# Patient Record
Sex: Male | Born: 1955 | Race: White | Hispanic: No | Marital: Married | Smoking: Current every day smoker
Health system: Southern US, Community
[De-identification: ages and names within clinical notes are randomized; demographics above are authoritative.]

## PROBLEM LIST (undated history)

## (undated) DIAGNOSIS — F32A Depression, unspecified: Secondary | ICD-10-CM

## (undated) DIAGNOSIS — K429 Umbilical hernia without obstruction or gangrene: Secondary | ICD-10-CM

## (undated) DIAGNOSIS — R0600 Dyspnea, unspecified: Secondary | ICD-10-CM

## (undated) DIAGNOSIS — I10 Essential (primary) hypertension: Secondary | ICD-10-CM

## (undated) DIAGNOSIS — R918 Other nonspecific abnormal finding of lung field: Principal | ICD-10-CM

## (undated) DIAGNOSIS — F419 Anxiety disorder, unspecified: Secondary | ICD-10-CM

## (undated) DIAGNOSIS — R7303 Prediabetes: Secondary | ICD-10-CM

## (undated) DIAGNOSIS — G8929 Other chronic pain: Secondary | ICD-10-CM

## (undated) HISTORY — DX: Other nonspecific abnormal finding of lung field: R91.8

## (undated) HISTORY — PX: NO PAST SURGERIES: SHX2092

---

## 2013-04-03 ENCOUNTER — Encounter: Payer: Self-pay | Admitting: Family Medicine

## 2013-04-03 ENCOUNTER — Ambulatory Visit (INDEPENDENT_AMBULATORY_CARE_PROVIDER_SITE_OTHER): Payer: Managed Care, Other (non HMO) | Admitting: Family Medicine

## 2013-04-03 VITALS — BP 126/84 | HR 76 | Temp 98.7°F | Ht 72.0 in | Wt 212.0 lb

## 2013-04-03 DIAGNOSIS — R1031 Right lower quadrant pain: Secondary | ICD-10-CM

## 2013-04-03 DIAGNOSIS — Z1211 Encounter for screening for malignant neoplasm of colon: Secondary | ICD-10-CM

## 2013-04-03 DIAGNOSIS — R319 Hematuria, unspecified: Secondary | ICD-10-CM | POA: Insufficient documentation

## 2013-04-03 DIAGNOSIS — E663 Overweight: Secondary | ICD-10-CM

## 2013-04-03 LAB — POCT URINALYSIS DIPSTICK
Glucose, UA: NEGATIVE
Ketones, UA: NEGATIVE
Leukocytes, UA: NEGATIVE
Spec Grav, UA: 1.02
Urobilinogen, UA: 0.2

## 2013-04-03 NOTE — Assessment & Plan Note (Addendum)
Intermittent hematuria possibly present for years.   Small # RBC/hpf but present on exam - given smoking history and discomfort, I will obtain CT abd w/o contrast to eval for kidney stone as cause, will also obtain with contrast to eval bladder. Discussed DDx with patient. Pt agrees with plan. Discussed possible referral to urology depending on results of CT scan.

## 2013-04-03 NOTE — Assessment & Plan Note (Signed)
Not consistent with appendicitis, no hernia on exam today. ?ureteral stone related - await abd/pelvic CT scan ordered today.

## 2013-04-03 NOTE — Patient Instructions (Addendum)
Stool kit today. We will schedule CT scan of abdomen to evaluate for kidney stone or other cause of blood in urine. If normal CT scan, we will refer you to urologist for further evaluation. Return one Friday this week or next fasting for blood work (no lunch that day). Good to meet you today, call us with questions.

## 2013-04-03 NOTE — Progress Notes (Signed)
Subjective:    Patient ID: Terry [name], male    DOB: [birth year], 57 y.o.   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  rec f/u with PCP so presents today to establish.  F/u DOT physical scheduled 05/17/2013.  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   Unsure about family history. Smokes 1.5 ppd for 40 + yrs. No increase in exertion prior to urine test.  1 wk h/o RLQ pain, achey pain.  3-4/10 at its worse, currently mild.  Constant.  No dysuria, urgency, frequency,  No flank pain.  No nausea/vomiting, fevers/chills.  Caffeine: 3 cups coffee/day, 4 sodas/day Lives with wife, 1 dog Occupation: roll off truck driver Edu: [employer] Activity: no regular exercise, active some at work Diet: some water, mainly sodas, seldom fruits/vegetables, red meat 4-5 times/wk, no fish  Preventative: Colon cancer screening - discussed - will send home with iFOB. Body mass index is 28.75 kg/(m^2).  Possible lung cancer in family (smoker)  Medications and allergies reviewed and updated in chart.  Past histories reviewed and updated if relevant as below. There are no active problems to display for this patient.  Past Medical History  Diagnosis Date  . Hematuria    Past Surgical History  Procedure Laterality Date  . No past surgeries     History  Substance Use Topics  . Smoking status: Current Every Day Smoker -- 1.50 packs/day for 40 years    Types: Cigarettes    Start date: 06/14/1973  . Smokeless tobacco: Never Used  . Alcohol Use: No   Family History  Problem Relation Age of Onset  . Diabetes Mother   . Cancer Maternal Grandmother     unsure  . Arthritis Mother    No Known Allergies No current outpatient prescriptions on file prior to visit.   No current facility-administered medications on file prior to visit.     Review of Systems  Constitutional: Negative for fever, chills, activity change, appetite  change, fatigue and unexpected weight change.  HENT: Negative for hearing loss.   Eyes: Negative for visual disturbance.  Respiratory: Negative for cough, chest tightness, shortness of breath and wheezing.   Cardiovascular: Negative for chest pain, palpitations and leg swelling.  Gastrointestinal: Positive for abdominal pain (RLQ ache - see HPI). Negative for nausea, vomiting, diarrhea, constipation, blood in stool and abdominal distention.  Genitourinary: Negative for hematuria and difficulty urinating.  Musculoskeletal: Negative for arthralgias, myalgias and neck pain.  Skin: Negative for rash.  Neurological: Negative for dizziness, seizures, syncope and headaches.  Hematological: Negative for adenopathy. Does not bruise/bleed easily.  Psychiatric/Behavioral: Negative for dysphoric mood. The patient is not nervous/anxious.        Objective:   Physical Exam  Nursing note and vitals reviewed. Constitutional: He is oriented to person, place, and time. He appears well-developed and well-nourished. No distress.  HENT:  Head: Normocephalic and atraumatic.  Mouth/Throat: Oropharynx is clear and moist. No oropharyngeal exudate.  Eyes: Conjunctivae and EOM are normal. Pupils are equal, round, and reactive to light. No scleral icterus.  Neck: Normal range of motion. Neck supple. No thyromegaly present.  Cardiovascular: Normal rate, regular rhythm, normal heart sounds and intact distal pulses.   No murmur heard. Pulses:      Radial pulses are 2+ on the right side, and 2+ on the left side.  Pulmonary/Chest: Effort normal and breath sounds normal. No respiratory  distress. He has no wheezes. He has no rales.  Abdominal: Soft. Bowel sounds are normal. He exhibits no distension and no mass. There is no hepatosplenomegaly. There is tenderness (mild) in the right lower quadrant. There is no rigidity, no rebound, no guarding, no CVA tenderness and negative Murphy's sign. No hernia. Hernia confirmed  negative in the right inguinal area and confirmed negative in the left inguinal area.  Musculoskeletal: Normal range of motion. He exhibits no edema.  Lymphadenopathy:    He has no cervical adenopathy.       Right: No inguinal adenopathy present.       Left: No inguinal adenopathy present.  Neurological: He is alert and oriented to person, place, and time.  CN grossly intact, station and gait intact  Skin: Skin is warm and dry. No rash noted.  Psychiatric: He has a normal mood and affect. His behavior is normal. Judgment and thought content normal.       Assessment & Plan:

## 2013-04-06 ENCOUNTER — Ambulatory Visit (INDEPENDENT_AMBULATORY_CARE_PROVIDER_SITE_OTHER)
Admission: RE | Admit: 2013-04-06 | Discharge: 2013-04-06 | Disposition: A | Discharge status: Home / Self Care | Payer: Managed Care, Other (non HMO) | Source: Ambulatory Visit | Attending: Family Medicine | Admitting: Family Medicine

## 2013-04-06 DIAGNOSIS — R319 Hematuria, unspecified: Secondary | ICD-10-CM

## 2013-04-06 MED ORDER — IOHEXOL 300 MG/ML  SOLN
125.0000 mL | Freq: Once | INTRAMUSCULAR | Status: AC | PRN
  Administered 2013-04-06: 125 mL via INTRAVENOUS

## 2013-04-07 ENCOUNTER — Other Ambulatory Visit: Payer: Self-pay | Admitting: Family Medicine

## 2013-04-07 DIAGNOSIS — R319 Hematuria, unspecified: Secondary | ICD-10-CM

## 2013-04-10 ENCOUNTER — Ambulatory Visit: Payer: Managed Care, Other (non HMO) | Admitting: Family Medicine

## 2013-04-10 DIAGNOSIS — R195 Other fecal abnormalities: Secondary | ICD-10-CM

## 2013-04-10 DIAGNOSIS — Z1211 Encounter for screening for malignant neoplasm of colon: Secondary | ICD-10-CM

## 2013-04-13 ENCOUNTER — Other Ambulatory Visit (INDEPENDENT_AMBULATORY_CARE_PROVIDER_SITE_OTHER): Payer: Managed Care, Other (non HMO)

## 2013-04-13 DIAGNOSIS — E7800 Pure hypercholesterolemia, unspecified: Secondary | ICD-10-CM

## 2013-04-13 DIAGNOSIS — R1031 Right lower quadrant pain: Secondary | ICD-10-CM

## 2013-04-13 LAB — LIPID PANEL
Cholesterol: 180 mg/dL (ref 0–200)
LDL Cholesterol: 112 mg/dL — ABNORMAL HIGH (ref 0–99)
Total CHOL/HDL Ratio: 4.5 Ratio
VLDL: 28 mg/dL (ref 0–40)

## 2013-04-13 LAB — COMPREHENSIVE METABOLIC PANEL
ALT: 14 U/L (ref 0–53)
AST: 16 U/L (ref 0–37)
Albumin: 4.2 g/dL (ref 3.5–5.2)
Alkaline Phosphatase: 56 U/L (ref 39–117)
CO2: 27 mEq/L (ref 19–32)
Calcium: 9.3 mg/dL (ref 8.4–10.5)
Chloride: 104 mEq/L (ref 96–112)
Potassium: 4.3 mEq/L (ref 3.5–5.3)
Total Protein: 6.9 g/dL (ref 6.0–8.3)

## 2013-04-13 LAB — CBC WITH DIFFERENTIAL/PLATELET
Basophils Absolute: 0.1 10*3/uL (ref 0.0–0.1)
Basophils Relative: 0 % (ref 0–1)
Eosinophils Absolute: 0.3 10*3/uL (ref 0.0–0.7)
Eosinophils Relative: 2 % (ref 0–5)
HCT: 41.4 % (ref 39.0–52.0)
Hemoglobin: 14.5 g/dL (ref 13.0–17.0)
MCH: 31.3 pg (ref 26.0–34.0)
MCHC: 35 g/dL (ref 30.0–36.0)
Monocytes Absolute: 1.5 10*3/uL — ABNORMAL HIGH (ref 0.1–1.0)
Monocytes Relative: 12 % (ref 3–12)
Neutro Abs: 5.6 10*3/uL (ref 1.7–7.7)
Neutrophils Relative %: 44 % (ref 43–77)
RDW: 14.1 % (ref 11.5–15.5)

## 2013-04-19 ENCOUNTER — Other Ambulatory Visit: Payer: Self-pay

## 2013-04-24 ENCOUNTER — Telehealth: Payer: Self-pay | Admitting: Family Medicine

## 2013-04-24 NOTE — Telephone Encounter (Signed)
Keithan's wife brought in a wellness screening form to be filled out on [place].

## 2013-04-24 NOTE — Telephone Encounter (Signed)
Form in your IN box for completion/signature.

## 2013-04-24 NOTE — Telephone Encounter (Signed)
Form faxed

## 2013-04-24 NOTE — Telephone Encounter (Signed)
Signed and placed in Kim's box. 

## 2013-05-15 ENCOUNTER — Encounter: Payer: Self-pay | Admitting: *Deleted

## 2013-07-01 ENCOUNTER — Telehealth: Payer: Self-pay | Admitting: Family Medicine

## 2013-07-01 ENCOUNTER — Encounter: Payer: Self-pay | Admitting: Family Medicine

## 2013-07-01 DIAGNOSIS — R918 Other nonspecific abnormal finding of lung field: Secondary | ICD-10-CM

## 2013-07-01 HISTORY — DX: Other nonspecific abnormal finding of lung field: R91.8

## 2013-07-01 NOTE — Telephone Encounter (Signed)
Due for CT lungs for f/u pulm nodules - will order.

## 2013-07-02 NOTE — Telephone Encounter (Addendum)
Spoke with radiologist who looked at images and given small size of pulm nodules actually recommended rpt CT scan in 1 year - so will be due to obtain CT chest in October. Will cancel CT scan - plz notify patient or wife

## 2013-07-06 NOTE — Telephone Encounter (Signed)
Message left for patient to return my call.  

## 2013-07-11 NOTE — Telephone Encounter (Signed)
Message left for patient to return my call.  

## 2013-07-16 ENCOUNTER — Encounter: Payer: Self-pay | Admitting: *Deleted

## 2013-07-16 NOTE — Telephone Encounter (Signed)
Letter mailed notifying patient. 

## 2014-04-02 ENCOUNTER — Other Ambulatory Visit: Payer: Self-pay | Admitting: Family Medicine

## 2014-04-02 DIAGNOSIS — R918 Other nonspecific abnormal finding of lung field: Secondary | ICD-10-CM

## 2014-04-10 ENCOUNTER — Telehealth: Payer: Self-pay | Admitting: Family Medicine

## 2014-04-10 NOTE — Telephone Encounter (Signed)
Called the patient to schedule FU CT scan. Spoke with wife and she was checking when the best time would be to schedule. Received a VM from wife Steward DroneBrenda that her husband said he didn't want to have another CT Chest that if it wasn't broke dont fix it. I have left two more messages and they have not returned my calls. Pls advise.

## 2014-04-10 NOTE — Telephone Encounter (Signed)
Noted. Ok to cancel CT scan - will discuss at next visit.

## 2014-04-11 NOTE — Telephone Encounter (Signed)
Please cancel the CT order in Epic as I am not able to cancel your order, only Referrals.

## 2014-11-08 IMAGING — CT CT ABD-PEL WO/W CM
2 of 9 series · 11 of 46 positions shown, 18 images · IV contrast (omnipaque)
Comparison: None.

CLINICAL DATA: Hematuria, right lower quadrant discomfort

EXAM:
CT ABDOMEN AND PELVIS WITHOUT AND WITH CONTRAST
TECHNIQUE: Multidetector CT imaging of the abdomen and pelvis was performed
without contrast material in one or both body regions, followed by
contrast material(s) and further sections in one or both body
regions.
CONTRAST:  125mL OMNIPAQUE IOHEXOL 300 MG/ML  SOLN

[Series 2: hematuria without >45 · axial · non-contrast · 0.77mm/px · z∈[-406,+20]mm · 9 of 107 slices shown, 15 images]
[im 11/107  soft-tissue]
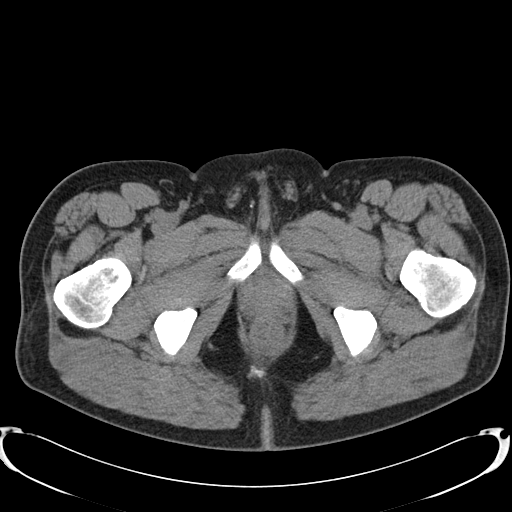
[im 11/107  bone]
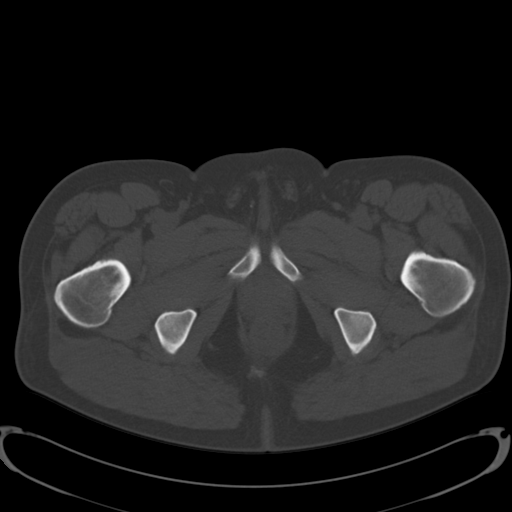
[im 22/107  soft-tissue]
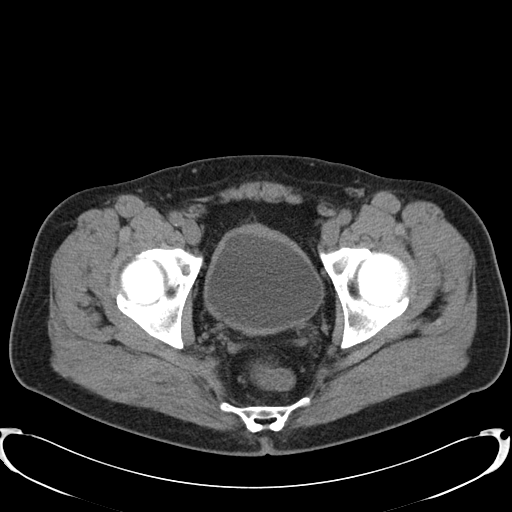
[im 32/107  soft-tissue]
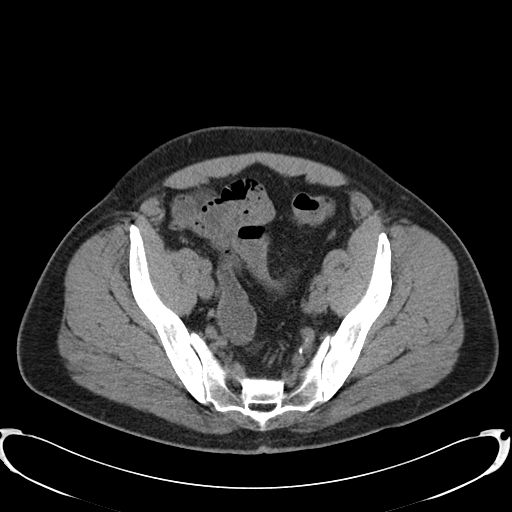
[im 43/107  soft-tissue]
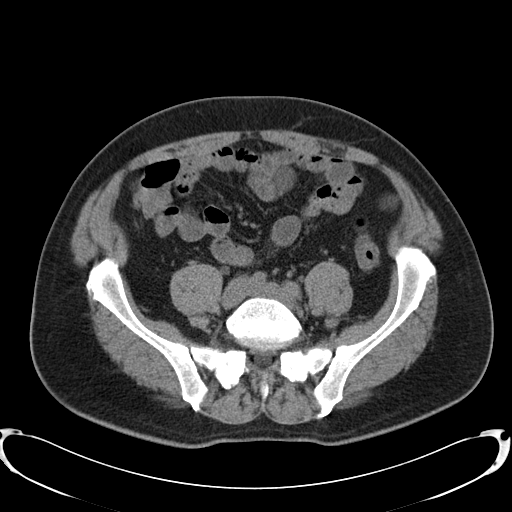
[im 54/107  soft-tissue]
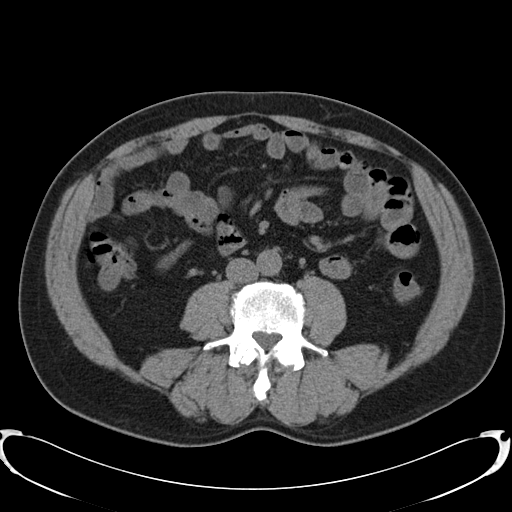
[im 64/107  soft-tissue]
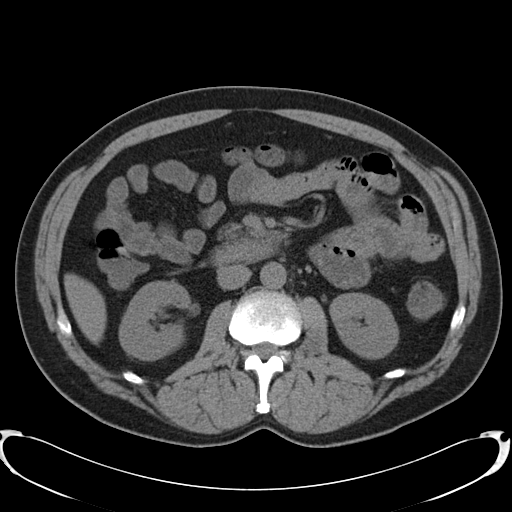
[im 64/107  lung]
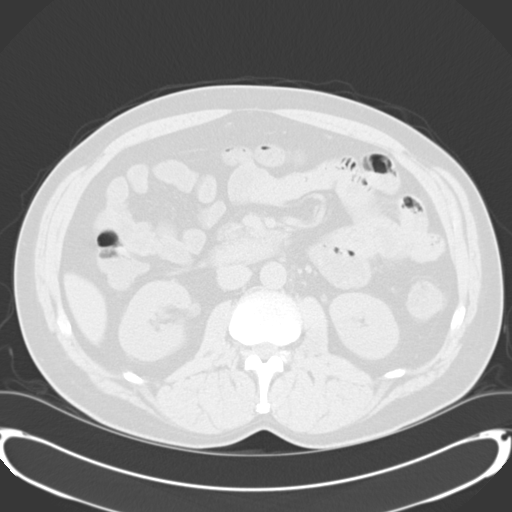
[im 75/107  soft-tissue]
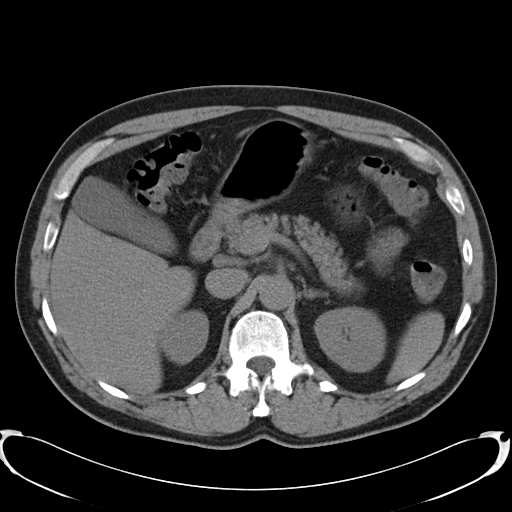
[im 75/107  lung]
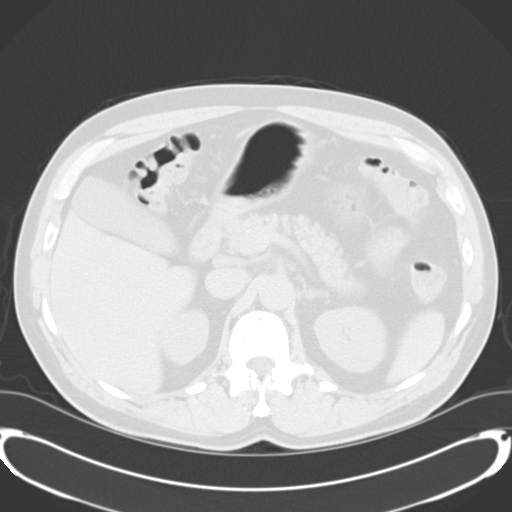
[im 85/107  soft-tissue]
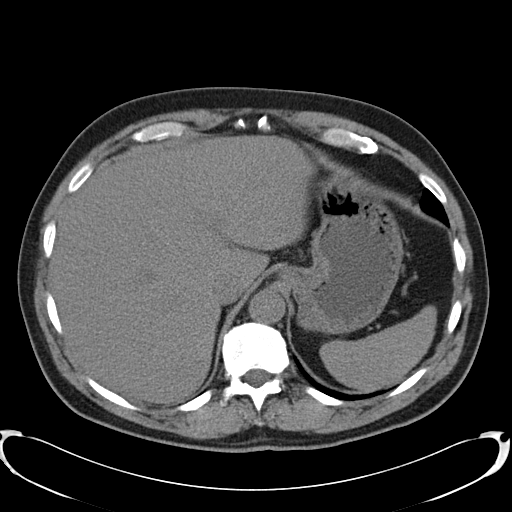
[im 85/107  lung]
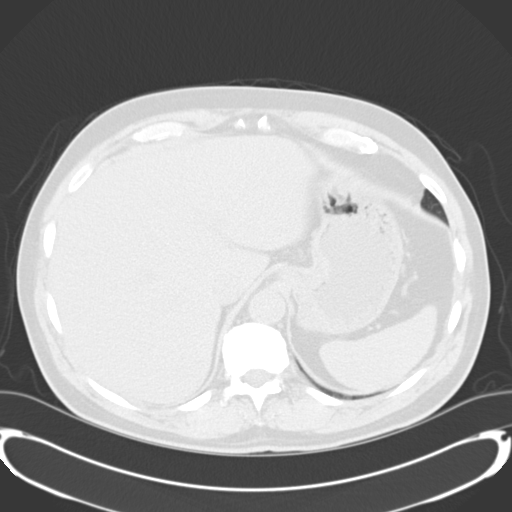
[im 96/107  soft-tissue]
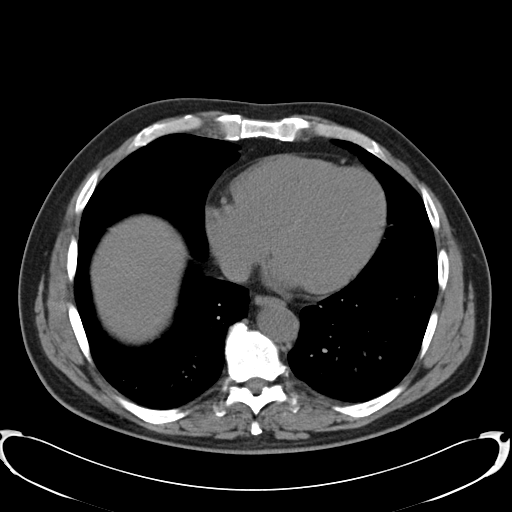
[im 96/107  lung]
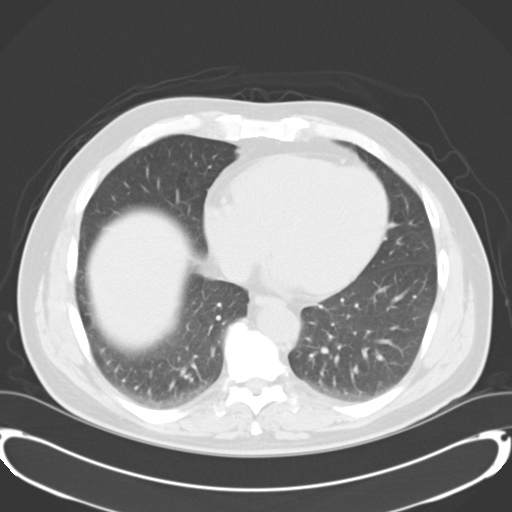
[im 96/107  bone]
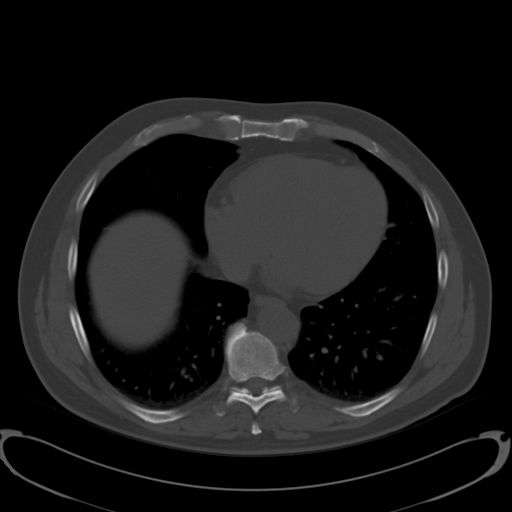

[Series 602: cor 100 sec delay · coronal · delayed · 1.08mm/px · 2 of 129 slices shown, 3 images]
[im 43/129  soft-tissue]
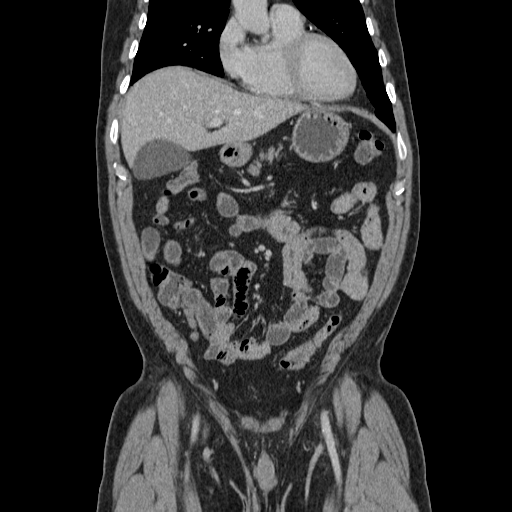
[im 43/129  bone]
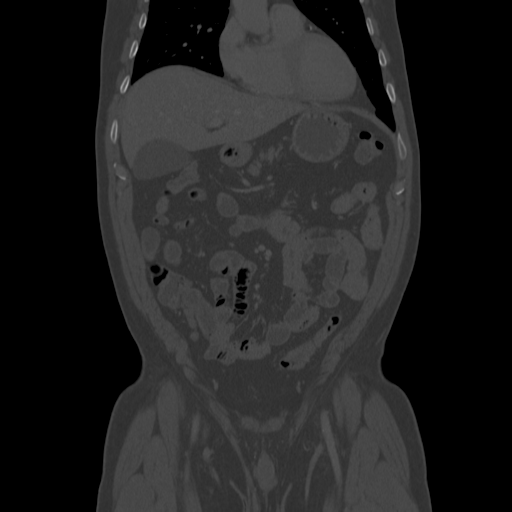
[im 86/129  soft-tissue]
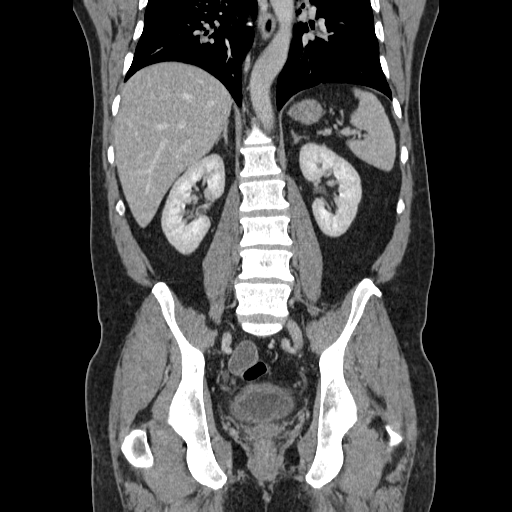

[11 of 46 positions shown; findings below may reference images not displayed]

FINDINGS: Mild nodularity in the right middle lobe (series 3/ image 1).
Additional 3 mm nodules in the left upper lobe (series 3/ image 1)
and left lower lobe (series 3/image 10).

Coronary atherosclerosis.

Liver, spleen, pancreas, and adrenal glands are within normal
limits.

Gallbladder is unremarkable. No intrahepatic or extrahepatic ductal
dilatation.

Kidneys are within normal limits. No renal calculi or
hydronephrosis.

No evidence of bowel obstruction. Normal appendix.

No evidence of abdominal aortic aneurysm.

No abdominopelvic ascites.

No suspicious abdominopelvic lymphadenopathy.

Prostate is unremarkable.

No ureteral or bladder calculi.

Mildly thick-walled bladder.

On delayed imaging, there are no filling defects in the bilateral
opacified proximal collecting systems, ureters, or bladder.

Mild degenerative changes of the visualized thoracolumbar spine.
IMPRESSION: No renal, ureteral, or bladder calculi. No hydronephrosis.

No enhancing renal lesions.

No filling defects in the bilateral opacified proximal collecting
systems, ureters, or bladder.

Mildly thick-walled bladder, correlate for cystitis.

Mild nodularity in the visualized right middle lobe, left upper
lobe, and left lower lobe. Consider dedicated CT chest for further
evaluation.

Coronary atherosclerosis.

## 2018-10-02 ENCOUNTER — Encounter: Payer: Self-pay | Admitting: Internal Medicine

## 2018-10-02 ENCOUNTER — Other Ambulatory Visit: Payer: Self-pay

## 2018-10-02 ENCOUNTER — Ambulatory Visit (INDEPENDENT_AMBULATORY_CARE_PROVIDER_SITE_OTHER)
Admission: RE | Admit: 2018-10-02 | Discharge: 2018-10-02 | Disposition: A | Discharge status: Home / Self Care | Payer: Managed Care, Other (non HMO) | Source: Ambulatory Visit | Attending: Internal Medicine | Admitting: Internal Medicine

## 2018-10-02 ENCOUNTER — Ambulatory Visit (INDEPENDENT_AMBULATORY_CARE_PROVIDER_SITE_OTHER): Payer: Managed Care, Other (non HMO) | Admitting: Internal Medicine

## 2018-10-02 DIAGNOSIS — G8929 Other chronic pain: Secondary | ICD-10-CM | POA: Diagnosis not present

## 2018-10-02 DIAGNOSIS — F17200 Nicotine dependence, unspecified, uncomplicated: Secondary | ICD-10-CM

## 2018-10-02 DIAGNOSIS — Z720 Tobacco use: Secondary | ICD-10-CM | POA: Insufficient documentation

## 2018-10-02 DIAGNOSIS — R918 Other nonspecific abnormal finding of lung field: Secondary | ICD-10-CM | POA: Diagnosis not present

## 2018-10-02 DIAGNOSIS — M79641 Pain in right hand: Secondary | ICD-10-CM

## 2018-10-02 NOTE — Assessment & Plan Note (Signed)
Will need follow up once the pandemic is over, we will get him scheduled for low dose lung CT for further evaluation of pulmonary nodules.

## 2018-10-02 NOTE — Progress Notes (Signed)
Virtual Visit via Video Note  I connected with Terry [name] on 10/02/18 at 10:45 AM EDT by a video enabled telemedicine application and verified that I am speaking with the correct person using two identifiers.   I discussed the limitations of evaluation and management by telemedicine and the availability of in person appointments. The patient expressed understanding and agreed to proceed.  Patient Location: Provider Location: Office  HPI  Pt here to reestablish care. He is transferring care from Dr. Sharen HonesGutierrez.  History of Pulmonary Nodules: He continues to smoke. He has no chronic cough or SOB. He has not had any follow up on this.  He reports chronic right hand pain. He reports this started 5 years ago. He describes the pain as tightness and throbbing. He has difficulty making a fist. He has some tingling in the index finger, and noticed swelling but denies numbness or weakness. He denies redness or warmth of the right hand. He has not noticed any rash. He denies any injury to the area. He has taken Ibuprofen in the past but reports when he does that, he notices blood in his stool so he doesn't take it anymore. He denies known hemorrhoids. He intermittently has rectal itching but no pain.  Smoker: 1.5 ppd x 40 years. Failed quitting with OTC. Patches. He has never tried Wellbutrin or Chantix. He is not interested in quitting at this time.  Flu: never Tetanus: unsure PSA Screening: > 5 years ago Colon Screening: never Vision Screening: as needed Dentist: annually  Past Medical History:  Diagnosis Date  . Hematuria   . Pulmonary nodules 07/01/2013    Current Outpatient Medications  Medication Sig Dispense Refill  . Multiple Vitamin (MULTIVITAMIN) tablet Take 1 tablet by mouth daily.    . Omega-3 Fatty Acids (FISH OIL) 1000 MG CAPS Take 1 capsule by mouth daily.    . vitamin B-12 (CYANOCOBALAMIN) 500 MCG tablet Take 500 mcg by mouth daily.     No current facility-administered  medications for this visit.     No Known Allergies  Family History  Problem Relation Age of Onset  . Diabetes Mother   . Cancer Maternal Grandmother        unsure, ?lung (smoker)  . Arthritis Mother     Social History   Socioeconomic History  . Marital status: Married    Spouse name: Not on file  . Number of children: Not on file  . Years of education: Not on file  . Highest education level: Not on file  Occupational History  . Not on file  Social Needs  . Financial resource strain: Not on file  . Food insecurity:    Worry: Not on file    Inability: Not on file  . Transportation needs:    Medical: Not on file    Non-medical: Not on file  Tobacco Use  . Smoking status: Current Every Day Smoker    Packs/day: 1.50    Years: 40.00    Pack years: 60.00    Types: Cigarettes    Start date: 06/14/1973  . Smokeless tobacco: Never Used  Substance and Sexual Activity  . Alcohol use: No  . Drug use: No  . Sexual activity: Not on file  Lifestyle  . Physical activity:    Days per week: Not on file    Minutes per session: Not on file  . Stress: Not on file  Relationships  . Social connections:    Talks on phone: Not on file  Gets together: Not on file    Attends religious service: Not on file    Active member of club or organization: Not on file    Attends meetings of clubs or organizations: Not on file    Relationship status: Not on file  . Intimate partner violence:    Fear of current or ex partner: Not on file    Emotionally abused: Not on file    Physically abused: Not on file    Forced sexual activity: Not on file  Other Topics Concern  . Not on file  Social History Narrative   Caffeine: 3 cups coffee/day, 4 sodas/day   Lives with wife, 1 dog   Occupation: roll off truck driver   Edu: HS   Activity: no regular exercise, active some at work   Diet: some water, mainly sodas, seldom fruits/vegetables, red meat 4-5 times/wk, no fish    ROS:  Constitutional:  Denies fever, malaise, fatigue, headache or abrupt weight changes.  HEENT: Denies eye pain, eye redness, ear pain, ringing in the ears, wax buildup, runny nose, nasal congestion, bloody nose, or sore throat. Respiratory: Denies difficulty breathing, shortness of breath, cough or sputum production.   Cardiovascular: Denies chest pain, chest tightness, palpitations or swelling in the hands or feet.  Gastrointestinal: Denies abdominal pain, bloating, constipation, diarrhea or blood in the stool.  GU: Denies frequency, urgency, pain with urination, blood in urine, odor or discharge. Musculoskeletal: Pt reports right hand pain, swelling, decreased range of motion. Denies difficulty with gait, muscle pain.  Skin: Denies redness, rashes, lesions or ulcercations.  Neurological: Pt reports tingling in right index finger. Denies dizziness, difficulty with memory, difficulty with speech or problems with balance and coordination.  Psych: Denies anxiety, depression, SI/HI.  No other specific complaints in a complete review of systems (except as listed in HPI above).  PE:   Wt Readings from Last 3 Encounters:  04/03/13 212 lb (96.2 kg)    General: Appears his stated age, well developed, well nourished in NAD. Skin: Dry and intact. No rash noted.  Pulmonary/Chest: Normal effort. No respiratory distress.  Musculoskeletal: Normal flexion, extension and rotation of the right wrist. No joint swelling noted of the wrist. Unable to completely flex fingers, normal extension. Joint enlargement noted of multiple knuckles. Neurological: Alert and oriented. Negative Phalen. Negative Tinel. Psychiatric: Mood and affect normal. Behavior is normal. Judgment and thought content normal.     BMET    Component Value Date/Time   NA 137 04/13/2013 1625   K 4.3 04/13/2013 1625   CL 104 04/13/2013 1625   CO2 27 04/13/2013 1625   GLUCOSE 82 04/13/2013 1625   BUN 12 04/13/2013 1625   CREATININE 0.87 04/13/2013 1625    CALCIUM 9.3 04/13/2013 1625    Lipid Panel     Component Value Date/Time   CHOL 180 04/13/2013 1625   TRIG 141 04/13/2013 1625   HDL 40 04/13/2013 1625   CHOLHDL 4.5 04/13/2013 1625   VLDL 28 04/13/2013 1625   LDLCALC 112 (H) 04/13/2013 1625    CBC    Component Value Date/Time   WBC 12.6 (H) 04/13/2013 1625   RBC 4.64 04/13/2013 1625   HGB 14.5 04/13/2013 1625   HCT 41.4 04/13/2013 1625   PLT 370 04/13/2013 1625   MCV 89.2 04/13/2013 1625   MCH 31.3 04/13/2013 1625   MCHC 35.0 04/13/2013 1625   RDW 14.1 04/13/2013 1625   LYMPHSABS 5.2 (H) 04/13/2013 1625   MONOABS 1.5 (H) 04/13/2013  1625   EOSABS 0.3 04/13/2013 1625   BASOSABS 0.1 04/13/2013 1625    Hgb A1C No results found for: HGBA1C   Assessment and Plan:  Chronic Right Hand Pain:  Will obtain xray right hand today Encouraged regular ROM exercises of right hand Consider daily Meloxicam  See problem based charting for other issues   Follow Up Instructions:    I discussed the assessment and treatment plan with the patient. The patient was provided an opportunity to ask questions and all were answered. The patient agreed with the plan and demonstrated an understanding of the instructions.   The patient was advised to call back or seek an in-person evaluation if the symptoms worsen or if the condition fails to improve as anticipated.    Nicki Reaper, NP

## 2018-10-02 NOTE — Patient Instructions (Signed)
Hand Pain  Many things can cause hand pain. Some common causes are:  · An injury.  · Repeating the same movement with your hand over and over (overuse).  · Osteoporosis.  · Arthritis.  · Lumps in the tendons or joints of the hand and wrist (ganglion cysts).  · Infection.  Follow these instructions at home:  Pay attention to any changes in your symptoms. Take these actions to help with your discomfort:  · If directed, put ice on the affected area:  ? Put ice in a plastic bag.  ? Place a towel between your skin and the bag.  ? Leave the ice on for 15-20 minutes, 3?4 times a day for 2 days.  · Take over-the-counter and prescription medicines only as told by your health care provider.  · Minimize stress on your hands and wrists as much as possible.  · Take breaks from repetitive activity often.  · Do stretches as told by your health care provider.  · Do not do activities that make your pain worse.  Contact a health care provider if:  · Your pain does not get better after a few days of self-care.  · Your pain gets worse.  · Your pain affects your ability to do your daily activities.  Get help right away if:  · Your hand becomes warm, red, or swollen.  · Your hand is numb or tingling.  · Your hand is extremely swollen or deformed.  · Your hand or fingers turn white or blue.  · You cannot move your hand, wrist, or fingers.  This information is not intended to replace advice given to you by your health care provider. Make sure you discuss any questions you have with your health care provider.  Document Released: 06/27/2015 Document Revised: 11/06/2015 Document Reviewed: 06/26/2014  Elsevier Interactive Patient Education © 2019 [employer]

## 2018-10-02 NOTE — Assessment & Plan Note (Signed)
Unmotivated to quit Discussed the importance of smoking cessation

## 2018-10-04 ENCOUNTER — Telehealth: Payer: Self-pay | Admitting: Internal Medicine

## 2018-10-04 MED ORDER — MELOXICAM 7.5 MG PO TABS: 7.5000 mg | ORAL_TABLET | Freq: Every day | ORAL | 2 refills | Status: DC

## 2018-10-04 NOTE — Telephone Encounter (Signed)
Patient is returning call from the office. ° °

## 2018-10-04 NOTE — Addendum Note (Signed)
Addended by: Roena Malady on: 10/04/2018 04:05 PM   Modules accepted: Orders

## 2018-12-08 ENCOUNTER — Other Ambulatory Visit: Payer: Self-pay

## 2018-12-08 ENCOUNTER — Encounter: Payer: Self-pay | Admitting: Internal Medicine

## 2018-12-08 ENCOUNTER — Ambulatory Visit (INDEPENDENT_AMBULATORY_CARE_PROVIDER_SITE_OTHER): Payer: Managed Care, Other (non HMO) | Admitting: Internal Medicine

## 2018-12-08 DIAGNOSIS — F4323 Adjustment disorder with mixed anxiety and depressed mood: Secondary | ICD-10-CM | POA: Insufficient documentation

## 2018-12-08 MED ORDER — CITALOPRAM HYDROBROMIDE 10 MG PO TABS: 10.0000 mg | ORAL_TABLET | Freq: Every day | ORAL | 3 refills | Status: DC

## 2018-12-08 MED ORDER — HYDROXYZINE HCL 10 MG PO TABS: 10.0000 mg | ORAL_TABLET | Freq: Every day | ORAL | 2 refills | Status: DC | PRN
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
## 2018-12-08 NOTE — Patient Instructions (Signed)

## 2018-12-27 ENCOUNTER — Other Ambulatory Visit: Payer: Self-pay | Admitting: Internal Medicine

## 2018-12-28 ENCOUNTER — Ambulatory Visit (INDEPENDENT_AMBULATORY_CARE_PROVIDER_SITE_OTHER): Payer: Self-pay | Admitting: Psychology

## 2018-12-28 DIAGNOSIS — F438 Other reactions to severe stress: Secondary | ICD-10-CM

## 2020-05-05 IMAGING — DX RIGHT HAND - COMPLETE 3+ VIEW
3 series · 3 of 3 positions shown · non-contrast
Comparison: None.

CLINICAL DATA: Pain

EXAM:
RIGHT HAND - COMPLETE 3+ VIEW

[hand ap]
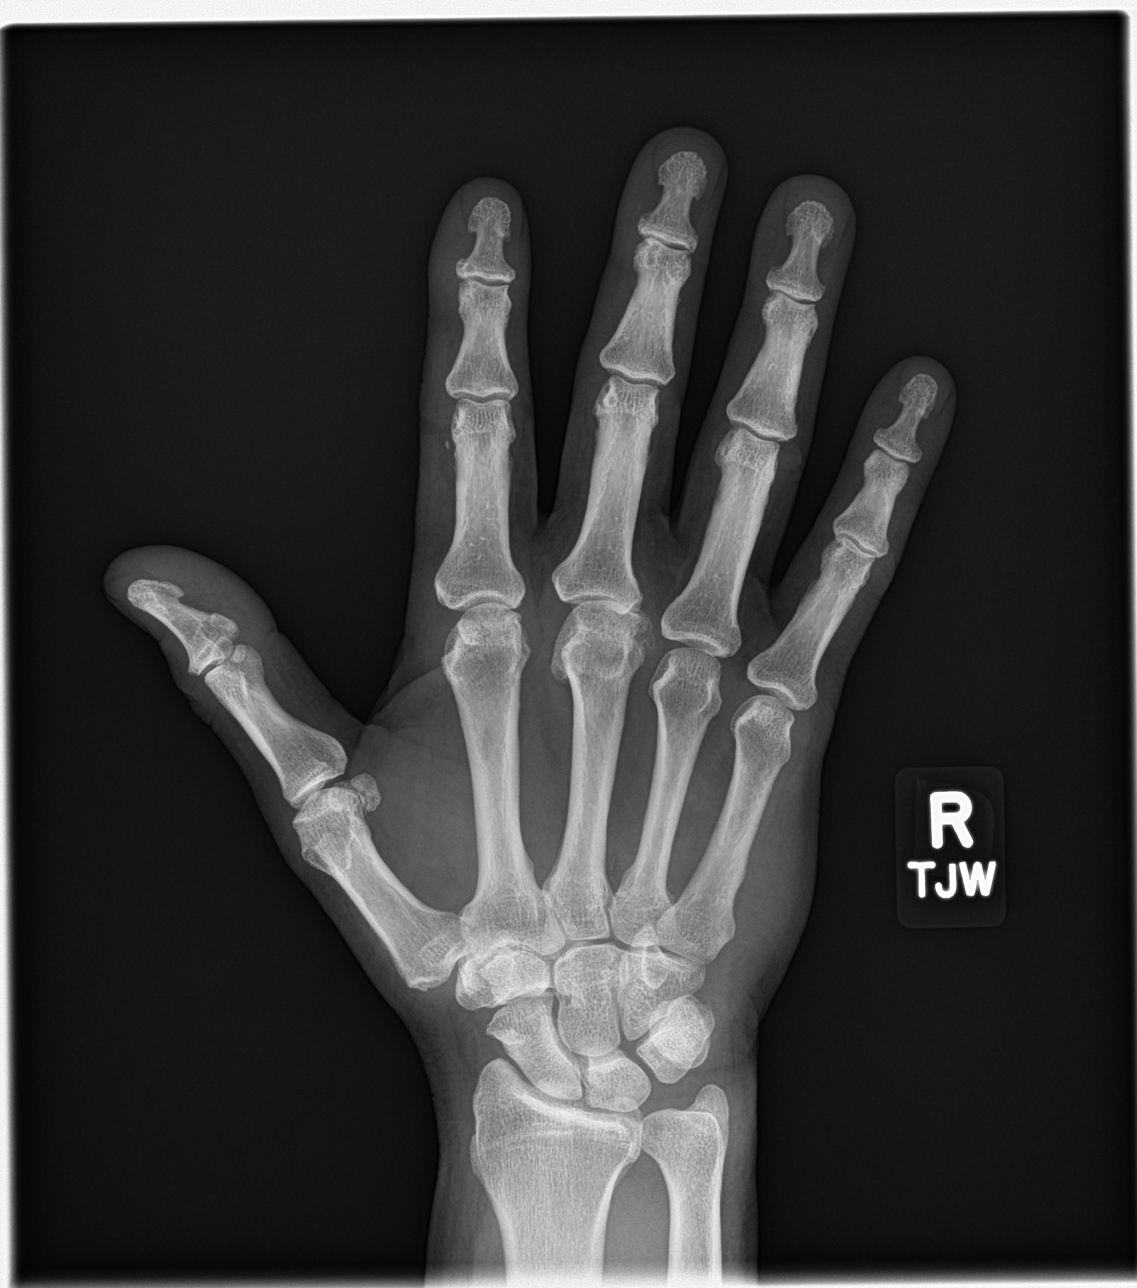

[hand obl]
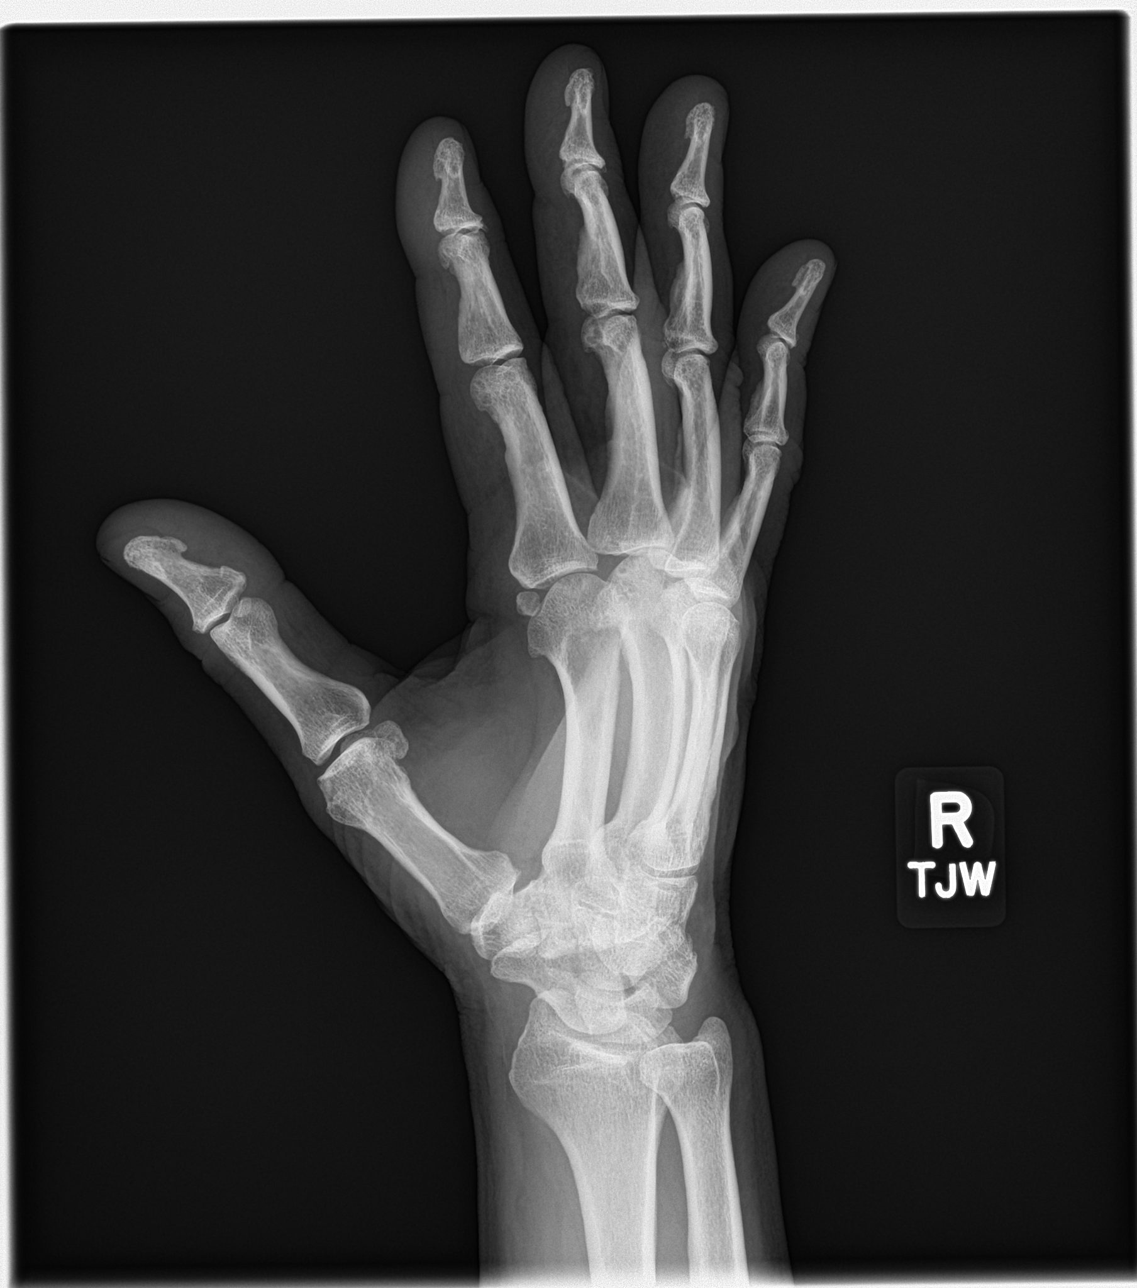

[hand lat]
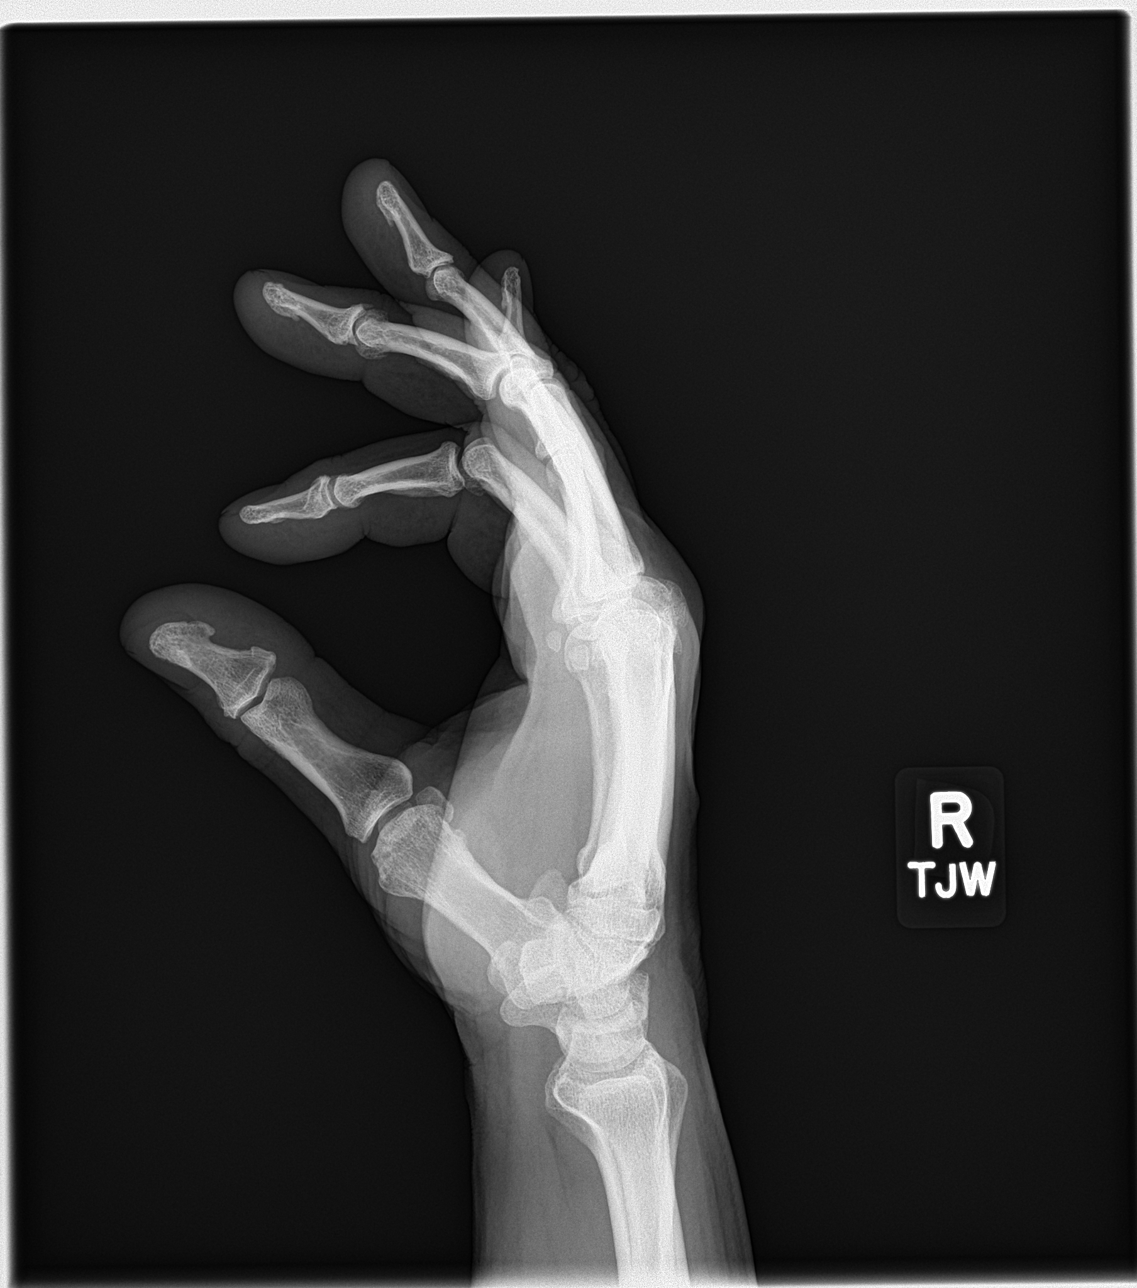

[3 of 3 positions shown; findings below may reference images not displayed]

FINDINGS: Frontal, oblique, and lateral views were obtained. There is no acute
fracture or dislocation. There is moderate osteoarthritic change in
the third MCP joint. There is milder osteoarthritic change in the
third and fourth DIP joints. No erosive changes. There is slight
calcification lateral to the distal aspect of the second proximal
phalanx which may represent localized tendinosis.
IMPRESSION: Scattered foci of osteoarthritic change, most notably in the third
MCP joint. No fracture or dislocation. No erosive change or
periostitis.

## 2022-11-22 ENCOUNTER — Ambulatory Visit: Payer: Self-pay | Admitting: Surgery

## 2022-11-22 DIAGNOSIS — K429 Umbilical hernia without obstruction or gangrene: Secondary | ICD-10-CM | POA: Diagnosis not present

## 2022-11-22 NOTE — H&P (Signed)
Subjective:   CC: Umbilical hernia without obstruction and without gangrene [K42.9]  HPI:  Terry [name] is a 67 y.o. male who returns for evaluation of above. Slightly larger since last visit, still asymptomatic but noticeable.  Still smoking 1-1.5packs a day   Past Medical History:  has a past medical history of Pulmonary nodule (07/01/2013).  Past Surgical History:  Past Surgical History:  Procedure Laterality Date   TOOTH EXTRACTION     wisdom teeth    Family History: family history includes Arthritis in his mother; Cancer in his maternal grandmother; Diabetes in his mother.  Social History:  reports that he has been smoking cigarettes. He started smoking about 49 years ago. He has a 49.4 pack-year smoking history. He has never used smokeless tobacco. Alcohol use questions deferred to the physician. Drug use questions deferred to the physician.  Current Medications: currently has no medications in their medication list.  Allergies:  Allergies as of 11/22/2022   (No Known Allergies)    ROS:  A 15 point review of systems was performed and pertinent positives and negatives noted in HPI   Objective:     BP 128/72   Pulse 96   Ht 182.9 cm (6')   Wt (!) 108.9 kg (240 lb)   BMI 32.55 kg/m   Constitutional :  Alert, cooperative, no distress  Lymphatics/Throat:  Supple, no lymphadenopathy  Respiratory:  clear to auscultation bilaterally  Cardiovascular:  regular rate and rhythm  Gastrointestinal: soft, non-tender; bowel sounds normal; no masses,  no organomegaly. umbilical hernia noted.  small, reducible, and no overlying skin changes  Musculoskeletal: Steady gait and movement  Skin: Cool and moist  Psychiatric: Normal affect, non-agitated, not confused       LABS:  N/a   RADS: N/a Assessment:       Umbilical hernia without obstruction and without gangrene [K42.9]  Plan:     1. Umbilical hernia without obstruction and without gangrene [K42.9]    Discussed the risk of surgery including recurrence, which can be up to 50% in the case of incisional or complex hernias, possible use of prosthetic materials (mesh) and the increased risk of mesh infxn if used, bleeding, chronic pain, post-op infxn, post-op SBO or ileus, and possible re-operation to address said risks. The risks of general anesthetic, if used, includes MI, CVA, sudden death or even reaction to anesthetic medications also discussed. Alternatives include continued observation.  Benefits include possible symptom relief, prevention of incarceration, strangulation, enlargement in size over time, and the risk of emergency surgery in the face of strangulation.   Typical post-op recovery time of 3-5 days with 2 weeks of activity restrictions were also discussed.  ED return precautions given for sudden increase in pain, size of hernia with accompanying fever, nausea, and/or vomiting.  The patient verbalized understanding and all questions were answered to the patient's satisfaction.   2. Patient has elected to proceed with surgical treatment, understanding increased perioperative risk. He firmly believes his smoking habits will not improve over time, and anxiety from hernia incarceration is high. Procedure will be scheduled. robotic assisted laparoscopic  labs/images/medications/previous chart entries reviewed personally and relevant changes/updates noted above.

## 2022-12-17 ENCOUNTER — Encounter: Payer: Self-pay | Admitting: Urgent Care

## 2022-12-17 ENCOUNTER — Encounter: Payer: Self-pay | Admitting: Surgery

## 2022-12-17 ENCOUNTER — Other Ambulatory Visit: Payer: Self-pay

## 2022-12-17 ENCOUNTER — Encounter
Admission: RE | Admit: 2022-12-17 | Discharge: 2022-12-17 | Disposition: A | Discharge status: Home / Self Care | Payer: Medicare (Managed Care) | Source: Ambulatory Visit | Attending: Surgery | Admitting: Surgery

## 2022-12-17 VITALS — Ht 72.0 in | Wt 240.0 lb

## 2022-12-17 DIAGNOSIS — R0602 Shortness of breath: Secondary | ICD-10-CM

## 2022-12-17 DIAGNOSIS — Z01812 Encounter for preprocedural laboratory examination: Secondary | ICD-10-CM

## 2022-12-17 DIAGNOSIS — F17200 Nicotine dependence, unspecified, uncomplicated: Secondary | ICD-10-CM

## 2022-12-17 HISTORY — DX: Dyspnea, unspecified: R06.00

## 2022-12-17 NOTE — Patient Instructions (Addendum)
[facility]. To find out your arrival time, please call [phone] between 1PM - 3PM on: 12/22/2022  If your arrival time is 6:00 am, do not arrive before that time as the [facility] entrance doors do not open until 6:00 am.  REMEMBER: Instructions that are not followed completely may result in serious medical risk, up to and including death; or upon the discretion of your surgeon and anesthesiologist your surgery may need to be rescheduled.  Do not eat food after midnight the night before surgery.  No gum chewing or hard candies to avoid acid production in your stomach.    One week prior to surgery: Stop Anti-inflammatories (NSAIDS) such as Advil, Aleve, Ibuprofen, Motrin, Naproxen, Naprosyn and Aspirin based products such as Excedrin, Goody's Powder, BC Powder. Stop ANY OVER THE COUNTER supplements until after surgery. You may however, continue to take Tylenol if needed for pain up until the day of surgery.   No Alcohol for 24 hours before or after surgery.  No Smoking including e-cigarettes for 24 hours before surgery.  No chewable tobacco products for at least 6 hours before surgery.  No nicotine patches on the day of surgery.  Do not use any "recreational" drugs for at least a week (preferably 2 weeks) before your surgery.  Please be advised that the combination of cocaine and anesthesia may have negative outcomes, up to and including death. If you test positive for cocaine, your surgery will be cancelled.  On the morning of surgery brush your teeth with toothpaste and water, you may rinse your mouth with mouthwash if you wish. Do not swallow any toothpaste or mouthwash.  Use CHG Soap as directed on instruction sheet.  Do not wear jewelry, make-up, hairpins, clips or nail polish.  Do not wear lotions, powders, or perfumes.   Do not shave body hair from the neck down 48 hours before surgery.  Contact lenses, hearing aids  and dentures may not be worn into surgery.  Do not bring valuables to the hospital. [facility] is not responsible for any missing/lost belongings or valuables.   Notify your doctor if there is any change in your medical condition (cold, fever, infection).  Wear comfortable clothing (specific to your surgery type) to the hospital.  After surgery, you can help prevent lung complications by doing breathing exercises.  Take deep breaths and cough every 1-2 hours. Your doctor may order a device called an Incentive Spirometer to help you take deep breaths. When coughing or sneezing, hold a pillow firmly against your incision with both hands. This is called "splinting." Doing this helps protect your incision. It also decreases belly discomfort.  If you are being admitted to the hospital overnight, leave your suitcase in the car. After surgery it may be brought to your room.  In case of increased patient census, it may be necessary for you, the patient, to continue your postoperative care in the Same Day Surgery department.  If you are being discharged the day of surgery, you will not be allowed to drive home. You will need a responsible individual to drive you home and stay with you for 24 hours after surgery.    Please call the Pre-admissions Testing Dept. at [phone] if you have any questions about these instructions.  Surgery Visitation Policy:  Patients having surgery or a procedure may have two visitors.  Children under the age of 30 must have an adult with them who is not the  patient.       Preparing for Surgery with CHLORHEXIDINE GLUCONATE (CHG) Soap  Chlorhexidine Gluconate (CHG) Soap  o An antiseptic cleaner that kills germs and bonds with the skin to continue killing germs even after washing  o Used for showering the night before surgery and morning of surgery  Before surgery, you can play an important role by reducing the number of germs on your skin.  CHG  (Chlorhexidine gluconate) soap is an antiseptic cleanser which kills germs and bonds with the skin to continue killing germs even after washing.  Please do not use if you have an allergy to CHG or antibacterial soaps. If your skin becomes reddened/irritated stop using the CHG.  1. Shower the NIGHT BEFORE SURGERY and the MORNING OF SURGERY with CHG soap.  2. If you choose to wash your hair, wash your hair first as usual with your normal shampoo.  3. After shampooing, rinse your hair and body thoroughly to remove the shampoo.  4. Use CHG as you would any other liquid soap. You can apply CHG directly to the skin and wash gently with a scrungie or a clean washcloth.  5. Apply the CHG soap to your body only from the neck down. Do not use on open wounds or open sores. Avoid contact with your eyes, ears, mouth, and genitals (private parts). Wash face and genitals (private parts) with your normal soap.  6. Wash thoroughly, paying special attention to the area where your surgery will be performed.  7. Thoroughly rinse your body with warm water.  8. Do not shower/wash with your normal soap after using and rinsing off the CHG soap.  9. Pat yourself dry with a clean towel.  10. Wear clean pajamas to bed the night before surgery.  12. Place clean sheets on your bed the night of your first shower and do not sleep with pets.  13. Shower again with the CHG soap on the day of surgery prior to arriving at the hospital.  14. Do not apply any deodorants/lotions/powders.  15. Please wear clean clothes to the hospital.

## 2022-12-20 ENCOUNTER — Encounter
Admission: RE | Admit: 2022-12-20 | Discharge: 2022-12-20 | Disposition: A | Discharge status: Home / Self Care | Payer: Medicare (Managed Care) | Source: Ambulatory Visit | Attending: Surgery | Admitting: Surgery

## 2022-12-20 DIAGNOSIS — Z01810 Encounter for preprocedural cardiovascular examination: Secondary | ICD-10-CM | POA: Diagnosis not present

## 2022-12-20 DIAGNOSIS — Z01818 Encounter for other preprocedural examination: Secondary | ICD-10-CM | POA: Diagnosis not present

## 2022-12-20 DIAGNOSIS — F17200 Nicotine dependence, unspecified, uncomplicated: Secondary | ICD-10-CM | POA: Insufficient documentation

## 2022-12-20 DIAGNOSIS — R0602 Shortness of breath: Secondary | ICD-10-CM | POA: Insufficient documentation

## 2022-12-20 DIAGNOSIS — Z01812 Encounter for preprocedural laboratory examination: Secondary | ICD-10-CM

## 2022-12-20 LAB — BASIC METABOLIC PANEL
Anion gap: 9 (ref 5–15)
BUN: 14 mg/dL (ref 8–23)
CO2: 24 mmol/L (ref 22–32)
Calcium: 9.2 mg/dL (ref 8.9–10.3)
Chloride: 101 mmol/L (ref 98–111)
Creatinine, Ser: 0.92 mg/dL (ref 0.61–1.24)
GFR, Estimated: >60 mL/min (ref >60)
Glucose, Bld: 84 mg/dL (ref 70–99)
Potassium: 4.1 mmol/L (ref 3.5–5.1)
Sodium: 134 mmol/L — ABNORMAL LOW (ref 135–145)

## 2022-12-20 LAB — CBC
HCT: 44.7 % (ref 39.0–52.0)
Hemoglobin: 14.8 g/dL (ref 13.0–17.0)
MCH: 29.7 pg (ref 26.0–34.0)
MCHC: 33.1 g/dL (ref 30.0–36.0)
MCV: 89.8 fL (ref 80.0–100.0)
Platelets: 309 10*3/uL (ref 150–400)
RBC: 4.98 MIL/uL (ref 4.22–5.81)
RDW: 12.9 % (ref 11.5–15.5)
WBC: 12.1 10*3/uL — ABNORMAL HIGH (ref 4.0–10.5)
nRBC: 0 % (ref 0.0–0.2)

## 2022-12-23 ENCOUNTER — Ambulatory Visit: Payer: Medicare (Managed Care) | Admitting: Registered Nurse

## 2022-12-23 ENCOUNTER — Encounter
Admission: RE | Disposition: A | Discharge status: Home / Self Care | Payer: Self-pay | Source: Home / Self Care | Attending: Surgery

## 2022-12-23 ENCOUNTER — Other Ambulatory Visit: Payer: Self-pay

## 2022-12-23 ENCOUNTER — Ambulatory Visit
Admission: RE | Admit: 2022-12-23 | Discharge: 2022-12-23 | Disposition: A | Discharge status: Home / Self Care | Payer: Medicare (Managed Care) | Attending: Surgery | Admitting: Surgery

## 2022-12-23 ENCOUNTER — Encounter: Payer: Self-pay | Admitting: Surgery

## 2022-12-23 DIAGNOSIS — K429 Umbilical hernia without obstruction or gangrene: Secondary | ICD-10-CM | POA: Insufficient documentation

## 2022-12-23 DIAGNOSIS — F17210 Nicotine dependence, cigarettes, uncomplicated: Secondary | ICD-10-CM | POA: Insufficient documentation

## 2022-12-23 DIAGNOSIS — K420 Umbilical hernia with obstruction, without gangrene: Secondary | ICD-10-CM

## 2022-12-23 HISTORY — DX: Other chronic pain: G89.29

## 2022-12-23 HISTORY — DX: Anxiety disorder, unspecified: F41.9

## 2022-12-23 HISTORY — DX: Depression, unspecified: F32.A

## 2022-12-23 HISTORY — DX: Umbilical hernia without obstruction or gangrene: K42.9

## 2022-12-23 HISTORY — PX: OTHER SURGICAL HISTORY: SHX169

## 2022-12-23 SURGERY — REPAIR, HERNIA, UMBILICAL, ROBOT-ASSISTED
Anesthesia: General | Site: Abdomen

## 2022-12-23 MED ORDER — CEFAZOLIN SODIUM-DEXTROSE 2-4 GM/100ML-% IV SOLN
2.0000 g | INTRAVENOUS | Status: AC
  Administered 2022-12-23: 2 g via INTRAVENOUS

## 2022-12-23 MED ORDER — SODIUM CHLORIDE FLUSH 0.9 % IV SOLN
INTRAVENOUS | Status: AC
  Filled 2022-12-23: qty 30

## 2022-12-23 MED ORDER — ACETAMINOPHEN 500 MG PO TABS
1000.0000 mg | ORAL_TABLET | ORAL | Status: AC
  Administered 2022-12-23: 1000 mg via ORAL

## 2022-12-23 MED ORDER — SODIUM CHLORIDE (PF) 0.9 % IJ SOLN
INTRAMUSCULAR | Status: AC
  Filled 2022-12-23: qty 50

## 2022-12-23 MED ORDER — ONDANSETRON HCL 4 MG/2ML IJ SOLN
INTRAMUSCULAR | Status: AC
  Filled 2022-12-23: qty 2

## 2022-12-23 MED ORDER — FENTANYL CITRATE (PF) 100 MCG/2ML IJ SOLN
INTRAMUSCULAR | Status: AC
  Filled 2022-12-23: qty 2

## 2022-12-23 MED ORDER — FAMOTIDINE 20 MG PO TABS
20.0000 mg | ORAL_TABLET | Freq: Once | ORAL | Status: AC
  Administered 2022-12-23: 20 mg via ORAL

## 2022-12-23 MED ORDER — BUPIVACAINE HCL (PF) 0.5 % IJ SOLN
INTRAMUSCULAR | Status: AC
  Filled 2022-12-23: qty 30

## 2022-12-23 MED ORDER — MIDAZOLAM HCL 2 MG/2ML IJ SOLN
INTRAMUSCULAR | Status: DC | PRN
  Administered 2022-12-23: 2 mg via INTRAVENOUS

## 2022-12-23 MED ORDER — HYDROCODONE-ACETAMINOPHEN 5-325 MG PO TABS: 1.0000 | ORAL_TABLET | Freq: Four times a day (QID) | ORAL | 0 refills | Status: DC | PRN

## 2022-12-23 MED ORDER — CHLORHEXIDINE GLUCONATE CLOTH 2 % EX PADS
6.0000 | MEDICATED_PAD | Freq: Once | CUTANEOUS | Status: AC
  Administered 2022-12-23: 6 via TOPICAL

## 2022-12-23 MED ORDER — EPINEPHRINE PF 1 MG/ML IJ SOLN
INTRAMUSCULAR | Status: AC
  Filled 2022-12-23: qty 1

## 2022-12-23 MED ORDER — CEFAZOLIN SODIUM-DEXTROSE 2-4 GM/100ML-% IV SOLN
INTRAVENOUS | Status: AC
  Filled 2022-12-23: qty 100

## 2022-12-23 MED ORDER — SODIUM CHLORIDE 0.9 % IV SOLN
INTRAVENOUS | Status: DC | PRN
  Administered 2022-12-23: 50 mL

## 2022-12-23 MED ORDER — PROPOFOL 10 MG/ML IV BOLUS
INTRAVENOUS | Status: DC | PRN
  Administered 2022-12-23: 50 mg via INTRAVENOUS
  Administered 2022-12-23: 200 mg via INTRAVENOUS

## 2022-12-23 MED ORDER — DEXAMETHASONE SODIUM PHOSPHATE 10 MG/ML IJ SOLN
INTRAMUSCULAR | Status: AC
  Filled 2022-12-23: qty 1

## 2022-12-23 MED ORDER — CHLORHEXIDINE GLUCONATE 0.12 % MT SOLN
15.0000 mL | Freq: Once | OROMUCOSAL | Status: AC
  Administered 2022-12-23: 15 mL via OROMUCOSAL

## 2022-12-23 MED ORDER — LACTATED RINGERS IV SOLN: INTRAVENOUS | Status: DC

## 2022-12-23 MED ORDER — PROPOFOL 10 MG/ML IV BOLUS
INTRAVENOUS | Status: AC
  Filled 2022-12-23: qty 40

## 2022-12-23 MED ORDER — MIDAZOLAM HCL 2 MG/2ML IJ SOLN
INTRAMUSCULAR | Status: AC
  Filled 2022-12-23: qty 2

## 2022-12-23 MED ORDER — FENTANYL CITRATE (PF) 100 MCG/2ML IJ SOLN
25.0000 ug | INTRAMUSCULAR | Status: DC | PRN
  Administered 2022-12-23 (×3): 25 ug via INTRAVENOUS
  Administered 2022-12-23: 50 ug via INTRAVENOUS
  Administered 2022-12-23: 25 ug via INTRAVENOUS

## 2022-12-23 MED ORDER — KETOROLAC TROMETHAMINE 30 MG/ML IJ SOLN
INTRAMUSCULAR | Status: AC
  Filled 2022-12-23: qty 1

## 2022-12-23 MED ORDER — SUGAMMADEX SODIUM 200 MG/2ML IV SOLN
INTRAVENOUS | Status: DC | PRN
  Administered 2022-12-23: 217.8 mg via INTRAVENOUS

## 2022-12-23 MED ORDER — OXYCODONE HCL 5 MG PO TABS
ORAL_TABLET | ORAL | Status: AC
  Filled 2022-12-23: qty 1

## 2022-12-23 MED ORDER — DEXMEDETOMIDINE HCL IN NACL 80 MCG/20ML IV SOLN
INTRAVENOUS | Status: DC | PRN
  Administered 2022-12-23: 4 ug via INTRAVENOUS
  Administered 2022-12-23: 8 ug via INTRAVENOUS

## 2022-12-23 MED ORDER — CHLORHEXIDINE GLUCONATE 0.12 % MT SOLN
OROMUCOSAL | Status: AC
  Filled 2022-12-23: qty 15

## 2022-12-23 MED ORDER — DOCUSATE SODIUM 100 MG PO CAPS: 100.0000 mg | ORAL_CAPSULE | Freq: Two times a day (BID) | ORAL | 0 refills | Status: AC | PRN

## 2022-12-23 MED ORDER — DEXAMETHASONE SODIUM PHOSPHATE 10 MG/ML IJ SOLN
INTRAMUSCULAR | Status: DC | PRN
  Administered 2022-12-23: 10 mg via INTRAVENOUS

## 2022-12-23 MED ORDER — ROCURONIUM BROMIDE 100 MG/10ML IV SOLN
INTRAVENOUS | Status: DC | PRN
  Administered 2022-12-23: 50 mg via INTRAVENOUS
  Administered 2022-12-23 (×2): 20 mg via INTRAVENOUS

## 2022-12-23 MED ORDER — ALBUTEROL SULFATE HFA 108 (90 BASE) MCG/ACT IN AERS
INHALATION_SPRAY | RESPIRATORY_TRACT | Status: DC | PRN
  Administered 2022-12-23: 4 via RESPIRATORY_TRACT

## 2022-12-23 MED ORDER — BUPIVACAINE HCL (PF) 0.5 % IJ SOLN
INTRAMUSCULAR | Status: DC | PRN
  Administered 2022-12-23: 30 mL

## 2022-12-23 MED ORDER — ORAL CARE MOUTH RINSE: 15.0000 mL | Freq: Once | OROMUCOSAL | Status: AC

## 2022-12-23 MED ORDER — KETOROLAC TROMETHAMINE 30 MG/ML IJ SOLN
INTRAMUSCULAR | Status: DC | PRN
  Administered 2022-12-23: 30 mg via INTRAVENOUS

## 2022-12-23 MED ORDER — BUPIVACAINE LIPOSOME 1.3 % IJ SUSP
INTRAMUSCULAR | Status: AC
  Filled 2022-12-23: qty 20

## 2022-12-23 MED ORDER — ACETAMINOPHEN 500 MG PO TABS
ORAL_TABLET | ORAL | Status: AC
  Filled 2022-12-23: qty 2

## 2022-12-23 MED ORDER — FENTANYL CITRATE (PF) 100 MCG/2ML IJ SOLN
INTRAMUSCULAR | Status: DC | PRN
  Administered 2022-12-23 (×4): 50 ug via INTRAVENOUS

## 2022-12-23 MED ORDER — ONDANSETRON HCL 4 MG/2ML IJ SOLN
INTRAMUSCULAR | Status: DC | PRN
  Administered 2022-12-23: 4 mg via INTRAVENOUS

## 2022-12-23 MED ORDER — GABAPENTIN 300 MG PO CAPS
ORAL_CAPSULE | ORAL | Status: AC
  Filled 2022-12-23: qty 1

## 2022-12-23 MED ORDER — OXYCODONE HCL 5 MG PO TABS
5.0000 mg | ORAL_TABLET | Freq: Once | ORAL | Status: AC | PRN
  Administered 2022-12-23: 5 mg via ORAL

## 2022-12-23 MED ORDER — ALBUTEROL SULFATE HFA 108 (90 BASE) MCG/ACT IN AERS
INHALATION_SPRAY | RESPIRATORY_TRACT | Status: AC
  Filled 2022-12-23: qty 6.7

## 2022-12-23 MED ORDER — GABAPENTIN 300 MG PO CAPS
300.0000 mg | ORAL_CAPSULE | ORAL | Status: AC
  Administered 2022-12-23: 300 mg via ORAL

## 2022-12-23 MED ORDER — FAMOTIDINE 20 MG PO TABS
ORAL_TABLET | ORAL | Status: AC
  Filled 2022-12-23: qty 1

## 2022-12-23 MED ORDER — ACETAMINOPHEN 325 MG PO TABS: 650.0000 mg | ORAL_TABLET | Freq: Three times a day (TID) | ORAL | 0 refills | Status: AC | PRN

## 2022-12-23 MED ORDER — KETOROLAC TROMETHAMINE 0.5 % OP SOLN
1.0000 [drp] | Freq: Four times a day (QID) | OPHTHALMIC | Status: DC
  Administered 2022-12-23: 1 [drp] via OPHTHALMIC
  Filled 2022-12-23 (×2): qty 3

## 2022-12-23 MED ORDER — OXYCODONE HCL 5 MG/5ML PO SOLN: 5.0000 mg | Freq: Once | ORAL | Status: AC | PRN

## 2022-12-23 MED ORDER — ROCURONIUM BROMIDE 10 MG/ML (PF) SYRINGE
PREFILLED_SYRINGE | INTRAVENOUS | Status: AC
  Filled 2022-12-23: qty 10

## 2022-12-23 SURGICAL SUPPLY — 51 items
ADH SKN CLS APL DERMABOND .7 (GAUZE/BANDAGES/DRESSINGS) ×1
BLADE SURG SZ11 CARB STEEL (BLADE) ×1 IMPLANT
COVER TIP SHEARS 8 DVNC (MISCELLANEOUS) ×1 IMPLANT
COVER WAND RF STERILE (DRAPES) ×1 IMPLANT
DERMABOND ADVANCED .7 DNX12 (GAUZE/BANDAGES/DRESSINGS) ×1 IMPLANT
DRAPE ARM DVNC X/XI (DISPOSABLE) ×3 IMPLANT
DRAPE COLUMN DVNC XI (DISPOSABLE) ×1 IMPLANT
ELECT CAUTERY BLADE 6.4 (BLADE) ×1 IMPLANT
ELECT REM PT RETURN 9FT ADLT (ELECTROSURGICAL) ×1
ELECTRODE REM PT RTRN 9FT ADLT (ELECTROSURGICAL) ×1 IMPLANT
FORCEPS BPLR FENES DVNC XI (FORCEP) ×1 IMPLANT
GLOVE BIOGEL PI IND STRL 7.0 (GLOVE) ×2 IMPLANT
GLOVE SURG SYN 6.5 ES PF (GLOVE) ×4 IMPLANT
GLOVE SURG SYN 6.5 PF PI (GLOVE) ×2 IMPLANT
GOWN STRL REUS W/ TWL LRG LVL3 (GOWN DISPOSABLE) ×3 IMPLANT
GOWN STRL REUS W/TWL LRG LVL3 (GOWN DISPOSABLE) ×4
GRASPER SUT TROCAR 14GX15 (MISCELLANEOUS) IMPLANT
IRRIGATOR SUCT 8 DISP DVNC XI (IRRIGATION / IRRIGATOR) IMPLANT
IV NS 1000ML (IV SOLUTION)
IV NS 1000ML BAXH (IV SOLUTION) IMPLANT
LABEL OR SOLS (LABEL) ×1 IMPLANT
MANIFOLD NEPTUNE II (INSTRUMENTS) ×1 IMPLANT
MESH VENTRALIGHT ST 4.5IN (Mesh General) IMPLANT
NDL DRIVE SUT CUT DVNC (INSTRUMENTS) ×1 IMPLANT
NDL HYPO 22X1.5 SAFETY MO (MISCELLANEOUS) ×1 IMPLANT
NDL INSUFFLATION 14GA 120MM (NEEDLE) ×1 IMPLANT
NEEDLE DRIVE SUT CUT DVNC (INSTRUMENTS) ×1 IMPLANT
NEEDLE HYPO 22X1.5 SAFETY MO (MISCELLANEOUS) ×1 IMPLANT
NEEDLE INSUFFLATION 14GA 120MM (NEEDLE) ×1 IMPLANT
OBTURATOR OPTICAL STND 8 DVNC (TROCAR) ×1
OBTURATOR OPTICALSTD 8 DVNC (TROCAR) ×1 IMPLANT
PACK LAP CHOLECYSTECTOMY (MISCELLANEOUS) ×1 IMPLANT
PENCIL SMOKE EVACUATOR (MISCELLANEOUS) ×1 IMPLANT
SCISSORS MNPLR CVD DVNC XI (INSTRUMENTS) ×1 IMPLANT
SEAL UNIV 5-12 XI (MISCELLANEOUS) ×3 IMPLANT
SET TUBE SMOKE EVAC HIGH FLOW (TUBING) ×1 IMPLANT
SOL ELECTROSURG ANTI STICK (MISCELLANEOUS) ×1
SOLUTION ELECTROSURG ANTI STCK (MISCELLANEOUS) ×1 IMPLANT
SUT MNCRL AB 4-0 PS2 18 (SUTURE) ×1 IMPLANT
SUT STRATAFIX 0 PDS+ CT-2 23 (SUTURE) ×2
SUT V-LOC 90 ABS DVC 3-0 CL (SUTURE) ×2 IMPLANT
SUT VIC AB 3-0 SH 27 (SUTURE) ×1
SUT VIC AB 3-0 SH 27X BRD (SUTURE) ×1 IMPLANT
SUT VICRYL 0 UR6 27IN ABS (SUTURE) ×1 IMPLANT
SUTURE STRATFX 0 PDS+ CT-2 23 (SUTURE) ×1 IMPLANT
SYR 30ML LL (SYRINGE) ×1 IMPLANT
SYSTEM WECK SHIELD CLOSURE (TROCAR) IMPLANT
TRAP FLUID SMOKE EVACUATOR (MISCELLANEOUS) ×1 IMPLANT
TRAY FOLEY MTR SLVR 16FR STAT (SET/KITS/TRAYS/PACK) ×1 IMPLANT
TROCAR Z-THREAD FIOS 5X100MM (TROCAR) IMPLANT
WATER STERILE IRR 500ML POUR (IV SOLUTION) ×1 IMPLANT

## 2022-12-23 NOTE — Discharge Instructions (Addendum)
Hernia repair, Care After This sheet gives you information about how to care for yourself after your procedure. Your health care provider may also give you more specific instructions. If you have problems or questions, contact your health care provider. What can I expect after the procedure? After your procedure, it is common to have the following: Pain in your abdomen, especially in the incision areas. You will be given medicine to control the pain. Tiredness. This is a normal part of the recovery process. Your energy level will return to normal over the next several weeks. Changes in your bowel movements, such as constipation or needing to go more often. Talk with your health care provider about how to manage this. Follow these instructions at home: Medicines  tylenol and advil as needed for discomfort.  Please alternate between the two every four hours as needed for pain.    Use narcotics, if prescribed, only when tylenol and motrin is not enough to control pain.  325-650mg every 8hrs to max of 3000mg/24hrs (including the 325mg in every norco dose) for the tylenol.    Advil up to 800mg per dose every 8hrs as needed for pain.   PLEASE RECORD NUMBER OF PILLS TAKEN UNTIL NEXT FOLLOW UP APPT.  THIS WILL HELP DETERMINE HOW READY YOU ARE TO BE RELEASED FROM ANY ACTIVITY RESTRICTIONS Do not drive or use heavy machinery while taking prescription pain medicine. Do not drink alcohol while taking prescription pain medicine.  Incision care    Follow instructions from your health care provider about how to take care of your incision areas. Make sure you: Keep your incisions clean and dry. Wash your hands with soap and water before and after applying medicine to the areas, and before and after changing your bandage (dressing). If soap and water are not available, use hand sanitizer. Change your dressing as told by your health care provider. Leave stitches (sutures), skin glue, or adhesive strips in  place. These skin closures may need to stay in place for 2 weeks or longer. If adhesive strip edges start to loosen and curl up, you may trim the loose edges. Do not remove adhesive strips completely unless your health care provider tells you to do that. Do not wear tight clothing over the incisions. Tight clothing may rub and irritate the incision areas, which may cause the incisions to open. Do not take baths, swim, or use a hot tub until your health care provider approves. OK TO SHOWER IN 24HRS.   Check your incision area every day for signs of infection. Check for: More redness, swelling, or pain. More fluid or blood. Warmth. Pus or a bad smell. Activity Avoid lifting anything that is heavier than 10 lb (4.5 kg) for 2 weeks or until your health care provider says it is okay. No pushing/pulling greater than 30lbs You may resume normal activities as told by your health care provider. Ask your health care provider what activities are safe for you. Take rest breaks during the day as needed. Eating and drinking Follow instructions from your health care provider about what you can eat after surgery. To prevent or treat constipation while you are taking prescription pain medicine, your health care provider may recommend that you: Drink enough fluid to keep your urine clear or pale yellow. Take over-the-counter or prescription medicines. Eat foods that are high in fiber, such as fresh fruits and vegetables, whole grains, and beans. Limit foods that are high in fat and processed sugars, such as fried and   sweet foods. General instructions Ask your health care provider when you will need an appointment to get your sutures or staples removed. Keep all follow-up visits as told by your health care provider. This is important. Contact a health care provider if: You have more redness, swelling, or pain around your incisions. You have more fluid or blood coming from the incisions. Your incisions feel  warm to the touch. You have pus or a bad smell coming from your incisions or your dressing. You have a fever. You have an incision that breaks open (edges not staying together) after sutures or staples have been removed. You develop a rash. You have chest pain or difficulty breathing. You have pain or swelling in your legs. You feel light-headed or you faint. Your abdomen swells (becomes distended). You have nausea or vomiting. You have blood in your stool (feces). This information is not intended to replace advice given to you by your health care provider. Make sure you discuss any questions you have with your health care provider. Document Released: 12/18/2004 Document Revised: 02/17/2018 Document Reviewed: 03/01/2016 Elsevier Interactive Patient Education  2019 [employer]    AMBULATORY SURGERY  DISCHARGE INSTRUCTIONS   The drugs that you were given will stay in your system until tomorrow so for the next 24 hours you should not:  Drive an automobile Make any legal decisions Drink any alcoholic beverage   You may resume regular meals tomorrow.  Today it is better to start with liquids and gradually work up to solid foods.  You may eat anything you prefer, but it is better to start with liquids, then soup and crackers, and gradually work up to solid foods.   Please notify your doctor immediately if you have any unusual bleeding, trouble breathing, redness and pain at the surgery site, drainage, fever, or pain not relieved by medication.    Your post-operative visit with Dr.                                       is: Date:                        Time:    Please call to schedule your post-operative visit.  Additional Instructions:  

## 2022-12-23 NOTE — Op Note (Signed)
Preoperative diagnosis: umbilical hernia, initial, reducible Postoperative diagnosis: same x2  Procedure: Robotic assisted laparoscopic umbilical hernia repair with mesh  Anesthesia: general  Surgeon: Sung Amabile  Wound Classification: Clean  Specimen: none  Complications: None  Estimated Blood Loss: 10ml  Indications:see HPI  Findings: Umbilical, initial, reducible hernia x2 4. Tension free repair achieved with bard ventralight ST mesh and suture 5. Adequate hemostasis  Description of procedure: The patient was brought to the operating room and general anesthesia was induced. A time-out was completed verifying correct patient, procedure, site, positioning, and implant(s) and/or special equipment prior to beginning this procedure. Antibiotics were administered prior to making the incision. SCDs placed. The anterior abdominal wall was prepped and draped in the standard sterile fashion.   Palmer's point chosen for entry.  Veress needle placed and abdomen insufflated to 15cm without any dramatic increase in pressure.  Needle removed and optiview technique used to place 5mm port at same point.  No injury noted during placement. Exparel was infused in a TAP block. 3 additional ports, 8mm x2 and 12mm, along left lateral aspect placed.  Xi robot then docked into place.  Hernia contents noted and reduced with combination of blunt, sharp dissection with scissors and fenestrated forceps.  Peritoneal flap creation attempted but unsuccessful due to thin peritoneum, so proceeded with IPOM repair. Hemostasis achieved throughout this portion.  Once all hernia contents reduced, there was noted to be a 2 umbilical hernia with bridge.  Total defect measurement 4cm x 2.5cm.    Insufflation dropped to 10mm and transfacial suture with 0 stratafix x2 used to primarily close defects under minimal tension. Bard echo plus protected 11.4 mesh was placed within the abdominal cavity through 12mm port and secured to  the abdominal wall centered over the defect using the 0 stratafix previously used to primarily close defect.  The mesh was then circumferentially sutured into the anterior abdominal wall using 3-0 VLock x2.  Any bleeding noted during this portion was no longer actively bleeding by end of securing mesh and tightening the suture.  Portion of peritoneum resutured back to abdominal wall using 3-0 v-lock x3, partially covering mesh as well.  Robot was undocked.  The 12mm cannula was removed and port site was closed using Efx Shield device and 0 vicryl suture, ensuring no bowels were injured during this process.  Abdomen then desufflated while camera within abdomen to ensure no signs of new bleed prior to removing camera and rest of ports completely.  All skin incisions closed with runninrg 4-0 Monocryl in a subcuticular fashion.  All wounds then dressed with Dermabond.  Patient was then successfully awakened and transferred to PACU in stable condition.  At the end of the procedure sponge and instrument counts were correct.

## 2022-12-23 NOTE — Transfer of Care (Signed)
Immediate Anesthesia Transfer of Care Note  Patient: Terry [name]  Procedure(s) Performed: XI ROBOT ASSISTED UMBILICAL HERNIA REPAIR w/ mesh (Abdomen)  Patient Location: PACU  Anesthesia Type:General  Level of Consciousness: alert  and patient cooperative  Airway & Oxygen Therapy: Patient Spontanous Breathing and Patient connected to face mask oxygen  Post-op Assessment: Report given to RN and Post -op Vital signs reviewed and stable  Post vital signs: stable  Last Vitals:  Vitals Value Taken Time  BP 167/102 12/23/22 1320  Temp    Pulse 90 12/23/22 1322  Resp 18 12/23/22 1322  SpO2 94 % 12/23/22 1322  Vitals shown include unfiled device data.  Last Pain:  Vitals:   12/23/22 1017  TempSrc: Temporal  PainSc: 0-No pain         Complications: No notable events documented.

## 2022-12-23 NOTE — Anesthesia Postprocedure Evaluation (Signed)
Anesthesia Post Note  Patient: Terry [name]  Procedure(s) Performed: XI ROBOT ASSISTED UMBILICAL HERNIA REPAIR w/ mesh (Abdomen)  Patient location during evaluation: PACU Anesthesia Type: General Level of consciousness: awake and alert Pain management: pain level controlled Vital Signs Assessment: post-procedure vital signs reviewed and stable Respiratory status: spontaneous breathing, nonlabored ventilation, respiratory function stable and patient connected to nasal cannula oxygen Cardiovascular status: blood pressure returned to baseline and stable Postop Assessment: no apparent nausea or vomiting Anesthetic complications: no  No notable events documented.   Last Vitals:  Vitals:   12/23/22 1430 12/23/22 1447  BP:  (!) 161/91  Pulse: 77 73  Resp: (!) 8 16  Temp: 36.7 C 36.5 C  SpO2: 95% 92%    Last Pain:  Vitals:   12/23/22 1447  TempSrc: Temporal  PainSc: 3                  Stephanie Coup

## 2022-12-23 NOTE — Anesthesia Procedure Notes (Signed)
Procedure Name: Intubation Date/Time: 12/23/2022 11:01 AM  Performed by: Emeterio Reeve, CRNAPre-anesthesia Checklist: Patient identified, Emergency Drugs available, Suction available and Patient being monitored Patient Re-evaluated:Patient Re-evaluated prior to induction Oxygen Delivery Method: Circle system utilized Preoxygenation: Pre-oxygenation with 100% oxygen Induction Type: IV induction Ventilation: Mask ventilation without difficulty Tube type: Oral Tube size: 7.5 mm Number of attempts: 1 Airway Equipment and Method: Stylet and Oral airway Placement Confirmation: ETT inserted through vocal cords under direct vision, positive ETCO2 and breath sounds checked- equal and bilateral Secured at: 23 cm Tube secured with: Tape Dental Injury: Teeth and Oropharynx as per pre-operative assessment  Comments: Cords clear, easy mask with OPA. CA

## 2022-12-23 NOTE — H&P (Signed)
Subjective:   CC: Umbilical hernia without obstruction and without gangrene [K42.9]  HPI:  Terry [name] is a 67 y.o. male who returns for evaluation of above. Slightly larger since last visit, still asymptomatic but noticeable.  Still smoking 1-1.5packs a day   Past Medical History:  has a past medical history of Pulmonary nodule (07/01/2013).  Past Surgical History:  Past Surgical History:  Procedure Laterality Date   TOOTH EXTRACTION     wisdom teeth    Family History: family history includes Arthritis in his mother; Cancer in his maternal grandmother; Diabetes in his mother.  Social History:  reports that he has been smoking cigarettes. He started smoking about 49 years ago. He has a 49.4 pack-year smoking history. He has never used smokeless tobacco. Alcohol use questions deferred to the physician. Drug use questions deferred to the physician.  Current Medications: currently has no medications in their medication list.  Allergies:  Allergies as of 11/22/2022   (No Known Allergies)    ROS:  A 15 point review of systems was performed and pertinent positives and negatives noted in HPI   Objective:     BP 128/72   Pulse 96   Ht 182.9 cm (6')   Wt (!) 108.9 kg (240 lb)   BMI 32.55 kg/m   Constitutional :  Alert, cooperative, no distress  Lymphatics/Throat:  Supple, no lymphadenopathy  Respiratory:  clear to auscultation bilaterally  Cardiovascular:  regular rate and rhythm  Gastrointestinal: soft, non-tender; bowel sounds normal; no masses,  no organomegaly. umbilical hernia noted.  small, reducible, and no overlying skin changes  Musculoskeletal: Steady gait and movement  Skin: Cool and moist  Psychiatric: Normal affect, non-agitated, not confused       LABS:  N/a   RADS: N/a Assessment:       Umbilical hernia without obstruction and without gangrene [K42.9]  Plan:     1. Umbilical hernia without obstruction and without gangrene [K42.9]    Discussed the risk of surgery including recurrence, which can be up to 50% in the case of incisional or complex hernias, possible use of prosthetic materials (mesh) and the increased risk of mesh infxn if used, bleeding, chronic pain, post-op infxn, post-op SBO or ileus, and possible re-operation to address said risks. The risks of general anesthetic, if used, includes MI, CVA, sudden death or even reaction to anesthetic medications also discussed. Alternatives include continued observation.  Benefits include possible symptom relief, prevention of incarceration, strangulation, enlargement in size over time, and the risk of emergency surgery in the face of strangulation.   Typical post-op recovery time of 3-5 days with 2 weeks of activity restrictions were also discussed.  ED return precautions given for sudden increase in pain, size of hernia with accompanying fever, nausea, and/or vomiting.  The patient verbalized understanding and all questions were answered to the patient's satisfaction.   2. Patient has elected to proceed with surgical treatment, understanding increased perioperative risk. He firmly believes his smoking habits will not improve over time, and anxiety from hernia incarceration is high. Procedure will be scheduled. robotic assisted laparoscopic  labs/images/medications/previous chart entries reviewed personally and relevant changes/updates noted above.   

## 2022-12-23 NOTE — Anesthesia Preprocedure Evaluation (Signed)
Anesthesia Evaluation  Patient identified by MRN, date of birth, ID band Patient awake    Reviewed: Allergy & Precautions, NPO status , Patient's Chart, lab work & pertinent test results  History of Anesthesia Complications Negative for: history of anesthetic complications  Airway Mallampati: III  TM Distance: >3 FB Neck ROM: full    Dental  (+) Chipped, Dental Advidsory Given   Pulmonary neg pulmonary ROS, Current Smoker   Pulmonary exam normal        Cardiovascular negative cardio ROS Normal cardiovascular exam     Neuro/Psych  PSYCHIATRIC DISORDERS      negative neurological ROS     GI/Hepatic negative GI ROS, Neg liver ROS,,,  Endo/Other  negative endocrine ROS    Renal/GU      Musculoskeletal   Abdominal   Peds  Hematology negative hematology ROS (+)   Anesthesia Other Findings Past Medical History: No date: Anxiety No date: Chronic hand pain, right No date: Depression No date: Dyspnea 07/01/2013: Pulmonary nodules No date: Umbilical hernia without obstruction and without gangrene  Past Surgical History: No date: NO PAST SURGERIES     Reproductive/Obstetrics negative OB ROS                             Anesthesia Physical Anesthesia Plan  ASA: 2  Anesthesia Plan: General ETT   Post-op Pain Management:    Induction: Intravenous  PONV Risk Score and Plan: 3 and Midazolam, Ondansetron and Dexamethasone  Airway Management Planned: Oral ETT  Additional Equipment:   Intra-op Plan:   Post-operative Plan: Extubation in OR  Informed Consent: I have reviewed the patients History and Physical, chart, labs and discussed the procedure including the risks, benefits and alternatives for the proposed anesthesia with the patient or authorized representative who has indicated his/her understanding and acceptance.     Dental Advisory Given  Plan Discussed with:  Anesthesiologist, CRNA and Surgeon  Anesthesia Plan Comments: (Patient consented for risks of anesthesia including but not limited to:  - adverse reactions to medications - damage to eyes, teeth, lips or other oral mucosa - nerve damage due to positioning  - sore throat or hoarseness - Damage to heart, brain, nerves, lungs, other parts of body or loss of life  Patient voiced understanding.)        Anesthesia Quick Evaluation

## 2022-12-23 NOTE — Interval H&P Note (Signed)
History and Physical Interval Note:  12/23/2022 10:33 AM  Terry [name]  has presented today for surgery, with the diagnosis of umbilical hernia w/o obstruction and w/o gangrene.  The various methods of treatment have been discussed with the patient and family. After consideration of risks, benefits and other options for treatment, the patient has consented to  Procedure(s): XI ROBOT ASSISTED UMBILICAL HERNIA REPAIR w/ mesh (N/A) as a surgical intervention.  The patient's history has been reviewed, patient examined, no change in status, stable for surgery.  I have reviewed the patient's chart and labs.  Questions were answered to the patient's satisfaction.    He still acknowledges increased perioperative risk from smoking, still wishes to proceed.  [name] [name]

## 2023-02-07 ENCOUNTER — Ambulatory Visit (INDEPENDENT_AMBULATORY_CARE_PROVIDER_SITE_OTHER): Payer: Medicare (Managed Care) | Admitting: Nurse Practitioner

## 2023-02-07 ENCOUNTER — Encounter: Payer: Self-pay | Admitting: Nurse Practitioner

## 2023-02-07 VITALS — BP 170/94 | HR 90 | Temp 97.6°F | Ht 72.0 in | Wt 243.0 lb

## 2023-02-07 DIAGNOSIS — Z720 Tobacco use: Secondary | ICD-10-CM | POA: Diagnosis not present

## 2023-02-07 DIAGNOSIS — R030 Elevated blood-pressure reading, without diagnosis of hypertension: Secondary | ICD-10-CM

## 2023-02-07 DIAGNOSIS — E669 Obesity, unspecified: Secondary | ICD-10-CM | POA: Insufficient documentation

## 2023-02-07 DIAGNOSIS — R918 Other nonspecific abnormal finding of lung field: Secondary | ICD-10-CM | POA: Diagnosis not present

## 2023-02-07 DIAGNOSIS — Z125 Encounter for screening for malignant neoplasm of prostate: Secondary | ICD-10-CM | POA: Diagnosis not present

## 2023-02-07 DIAGNOSIS — Z1211 Encounter for screening for malignant neoplasm of colon: Secondary | ICD-10-CM

## 2023-02-07 DIAGNOSIS — Z23 Encounter for immunization: Secondary | ICD-10-CM

## 2023-02-07 DIAGNOSIS — Z122 Encounter for screening for malignant neoplasm of respiratory organs: Secondary | ICD-10-CM

## 2023-02-07 DIAGNOSIS — R351 Nocturia: Secondary | ICD-10-CM

## 2023-02-07 DIAGNOSIS — R319 Hematuria, unspecified: Secondary | ICD-10-CM | POA: Diagnosis not present

## 2023-02-07 DIAGNOSIS — Z13220 Encounter for screening for lipoid disorders: Secondary | ICD-10-CM | POA: Diagnosis not present

## 2023-02-07 DIAGNOSIS — Z0000 Encounter for general adult medical examination without abnormal findings: Secondary | ICD-10-CM | POA: Diagnosis not present

## 2023-02-07 LAB — URINALYSIS, MICROSCOPIC ONLY

## 2023-02-07 LAB — COMPREHENSIVE METABOLIC PANEL
ALT: 21 U/L (ref 0–53)
AST: 16 U/L (ref 0–37)
Albumin: 4 g/dL (ref 3.5–5.2)
Alkaline Phosphatase: 61 U/L (ref 39–117)
BUN: 13 mg/dL (ref 6–23)
CO2: 27 mEq/L (ref 19–32)
Calcium: 9.2 mg/dL (ref 8.4–10.5)
Chloride: 103 mEq/L (ref 96–112)
Creatinine, Ser: 0.93 mg/dL (ref 0.40–1.50)
GFR: 86.34 mL/min (ref >60.00)
Glucose, Bld: 118 mg/dL — ABNORMAL HIGH (ref 70–99)
Potassium: 3.9 mEq/L (ref 3.5–5.1)
Sodium: 138 mEq/L (ref 135–145)
Total Bilirubin: 0.4 mg/dL (ref 0.2–1.2)
Total Protein: 6.6 g/dL (ref 6.0–8.3)

## 2023-02-07 LAB — POCT URINALYSIS DIPSTICK
Bilirubin, UA: NEGATIVE
Blood, UA: 25
Glucose, UA: NEGATIVE
Ketones, UA: NEGATIVE
Leukocytes, UA: NEGATIVE
Nitrite, UA: NEGATIVE
Protein, UA: NEGATIVE
Spec Grav, UA: 1.015 (ref 1.010–1.025)
Urobilinogen, UA: 0.2 E.U./dL
pH, UA: 5.5 (ref 5.0–8.0)

## 2023-02-07 LAB — CBC
HCT: 43.3 % (ref 39.0–52.0)
Hemoglobin: 14.3 g/dL (ref 13.0–17.0)
MCHC: 33.1 g/dL (ref 30.0–36.0)
MCV: 90.4 fl (ref 78.0–100.0)
Platelets: 335 10*3/uL (ref 150.0–400.0)
RBC: 4.79 Mil/uL (ref 4.22–5.81)
RDW: 13.7 % (ref 11.5–15.5)
WBC: 10.9 10*3/uL — ABNORMAL HIGH (ref 4.0–10.5)

## 2023-02-07 LAB — LIPID PANEL
Cholesterol: 194 mg/dL (ref 0–200)
HDL: 34 mg/dL — ABNORMAL LOW (ref >39.00)
NonHDL: 160.23
Total CHOL/HDL Ratio: 6
Triglycerides: 324 mg/dL — ABNORMAL HIGH (ref 0.0–149.0)
VLDL: 64.8 mg/dL — ABNORMAL HIGH (ref 0.0–40.0)

## 2023-02-07 LAB — HEMOGLOBIN A1C: Hgb A1c MFr Bld: 6 % (ref 4.6–6.5)

## 2023-02-07 LAB — LDL CHOLESTEROL, DIRECT: Direct LDL: 138 mg/dL

## 2023-02-07 LAB — PSA, MEDICARE: PSA: 0.91 ng/ml (ref 0.10–4.00)

## 2023-02-07 LAB — TSH: TSH: 3.74 u[IU]/mL (ref 0.35–5.50)

## 2023-02-07 NOTE — Assessment & Plan Note (Signed)
Likely prostatic in nature.  Patient is having weak stream and nocturia.  Will check UA along with PSA.

## 2023-02-07 NOTE — Progress Notes (Signed)
New Patient Office Visit  Subjective    Patient ID: Terry [name], male    DOB: 01/17/1956  Age: 67 y.o. MRN: [id number]  CC:  Chief Complaint  Patient presents with   Establish Care    Establish care    HPI LUMIR PAGEL presents to establish care  for complete physical and follow up of chronic conditions.  Elevated blood pressure: Patient has a history of elevated blood pressure without the diagnosis of hypertension or being on any antihypertensive medications per his report.  Immunizations: -Tetanus: unsure -Influenza: Refused -Shingles: Discussed in office -Pneumonia: Completed prevnar 20 today  Diet: Fair diet. 3 meals a day and snacks. water coffee and soda Exercise: No regular exercise.  Eye exam: Completes annually. glasses  Dental exam: needs updating  Colonoscopy: Discussed colonoscopy and cologuard.  Cologuard ordered Lung Cancer Screening: Ambulatory referral today.  Patient does have a history of pulmonary nodules on incidental finding on CT  PSA: Due  Sleep: states that he goes to bed 10-11 and he will not sleep well. States that he does go to the bathroom   Outpatient Encounter Medications as of 02/07/2023  Medication Sig   [DISCONTINUED] citalopram (CELEXA) 10 MG tablet Take 1 tablet (10 mg total) by mouth daily.   [DISCONTINUED] HYDROcodone-acetaminophen (NORCO) 5-325 MG tablet Take 1 tablet by mouth every 6 (six) hours as needed for up to 6 doses for moderate pain.   [DISCONTINUED] hydrOXYzine (ATARAX/VISTARIL) 10 MG tablet Take 1 tablet (10 mg total) by mouth daily as needed.   [DISCONTINUED] meloxicam (MOBIC) 7.5 MG tablet TAKE 1 TABLET BY MOUTH EVERY DAY   No facility-administered encounter medications on file as of 02/07/2023.    Past Medical History:  Diagnosis Date   Anxiety    Chronic hand pain, right    Depression    Dyspnea    Pulmonary nodules 07/01/2013   Umbilical hernia without obstruction and without gangrene     Past  Surgical History:  Procedure Laterality Date   hernia repair  12/23/2022   NO PAST SURGERIES      Family History  Problem Relation Age of Onset   Diabetes Mother    Arthritis Mother    Cancer Maternal Grandmother        unsure, ?lung (smoker)    Social History   Socioeconomic History   Marital status: Married    Spouse name: Steward Drone   Number of children: 3   Years of education: Not on file   Highest education level: Not on file  Occupational History   Not on file  Tobacco Use   Smoking status: Every Day    Current packs/day: 1.50    Average packs/day: 1.5 packs/day for 49.7 years (74.5 ttl pk-yrs)    Types: Cigarettes    Start date: 06/14/1973   Smokeless tobacco: Never  Vaping Use   Vaping status: Never Used  Substance and Sexual Activity   Alcohol use: Yes    Alcohol/week: 2.0 standard drinks of alcohol    Types: 2 Cans of beer per week    Comment: occasional   Drug use: Yes    Types: Marijuana    Comment: daily   Sexual activity: Yes  Other Topics Concern   Not on file  Social History Narrative   Caffeine: 3 cups coffee/day, 4 sodas/dayLives with wife, 1 dogOccupation: roll off truck driverEdu: HSActivity: no regular exercise, active some at workDiet: some water, mainly sodas, seldom fruits/vegetables, red meat 4-5 times/wk, no fish  Truck Dealer but retired    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  Respiratory:  Negative for shortness of breath.   Cardiovascular:  Negative for chest pain and leg swelling.  Gastrointestinal:  Negative for abdominal pain, blood in stool, constipation, diarrhea, nausea and vomiting.       BM daily    Genitourinary:  Negative for dysuria and hematuria.       Weak stream   Nocturia every 2-3 hours   Neurological:  Negative for tingling (left pinky finger that comes and goes) and headaches.  Psychiatric/Behavioral:  Negative for hallucinations and suicidal ideas.         Objective    BP (!) 170/94   Pulse 90   Temp 97.6 F (36.4 C) (Temporal)   Ht 6' (1.829 m)   Wt 243 lb (110.2 kg)   SpO2 97%   BMI 32.96 kg/m   Physical Exam Vitals and nursing note reviewed.  Constitutional:      Appearance: Normal appearance.  HENT:     Right Ear: Tympanic membrane, ear canal and external ear normal.     Left Ear: Tympanic membrane, ear canal and external ear normal.     Mouth/Throat:     Mouth: Mucous membranes are moist.     Pharynx: Oropharynx is clear.  Eyes:     Extraocular Movements: Extraocular movements intact.     Pupils: Pupils are equal, round, and reactive to light.  Cardiovascular:     Rate and Rhythm: Normal rate and regular rhythm.     Pulses: Normal pulses.     Heart sounds: Normal heart sounds.  Pulmonary:     Effort: Pulmonary effort is normal.     Breath sounds: Normal breath sounds.  Abdominal:     General: Bowel sounds are normal. There is no distension.     Palpations: There is no mass.     Tenderness: There is no abdominal tenderness.     Hernia: No hernia is present.  Genitourinary:    Comments: deferred Musculoskeletal:     Right lower leg: No edema.     Left lower leg: No edema.  Lymphadenopathy:     Cervical: No cervical adenopathy.  Skin:    General: Skin is warm.  Neurological:     General: No focal deficit present.     Mental Status: He is alert.     Deep Tendon Reflexes:     Reflex Scores:      Bicep reflexes are 2+ on the right side and 2+ on the left side.      Patellar reflexes are 2+ on the right side and 2+ on the left side.    Comments: Bilateral upper and lower extremity strength 5/5  Psychiatric:        Mood and Affect: Mood  normal.        Behavior: Behavior normal.        Thought Content: Thought content normal.        Judgment: Judgment normal.         Assessment & Plan:   Problem List Items Addressed This Visit       Respiratory  Pulmonary nodules    History of the same with longstanding tobacco abuse ambulatory referral for low-dose CT scan placed today        Other   Tobacco abuse    History of the same pending UA for microscopic hematuria      Relevant Orders   POCT urinalysis dipstick   Preventative health care - Primary    Discussed age-appropriate immunizations and screening exams.  Did review patient's personal, surgical, social, family histories.  Did discuss vaccines will update Prevnar 20 today.  Cologuard ordered for CRC screening.  PSA ordered for prostate cancer screening.  Referred to the lung cancer screening program since he is a smoker.  Patient was given information at discharge about preventative healthcare maintenance with anticipatory guidance.      Relevant Orders   CBC   Comprehensive metabolic panel   TSH   Nocturia    Likely prostatic in nature.  Patient is having weak stream and nocturia.  Will check UA along with PSA.      Relevant Orders   POCT urinalysis dipstick   Obesity (BMI 30-39.9)    Encouraged healthy lifestyle modifications inclusive of diet and exercise.  Did review with patient and medical recommendation for 30 minutes of exercise 5 times a week.  Patient will start slow with 10 to 15 minutes 1-2 times a week.      Relevant Orders   Hemoglobin A1c   Lipid panel   Elevated blood-pressure reading without diagnosis of hypertension    No history of hypertension per patient report.  Blood pressure elevated at beginning of office visit on recheck will have patient follow-up in 3 months for blood pressure recheck he will work on lifestyle modifications in the interim      Other Visit Diagnoses     Screening for lipid disorders       Relevant  Orders   Lipid panel   Need for pneumococcal 20-valent conjugate vaccination       Relevant Orders   Pneumococcal conjugate vaccine 20-valent (Prevnar 20)   Screening for prostate cancer       Relevant Orders   PSA, Medicare   Screening for colon cancer       Relevant Orders   Cologuard   Screening for lung cancer       Relevant Orders   Ambulatory Referral Lung Cancer Screening [facility]       Return in about 3 months (around 05/10/2023) for BP recheck.   Audria Nine, NP

## 2023-02-07 NOTE — Patient Instructions (Addendum)
Nice to see you today. I will be in touch with the labs once I have reviewed them Follow up with me in 3 months

## 2023-02-07 NOTE — Assessment & Plan Note (Signed)
History of the same pending UA for microscopic hematuria

## 2023-02-07 NOTE — Assessment & Plan Note (Signed)
No history of hypertension per patient report.  Blood pressure elevated at beginning of office visit on recheck will have patient follow-up in 3 months for blood pressure recheck he will work on lifestyle modifications in the interim

## 2023-02-07 NOTE — Assessment & Plan Note (Signed)
Encouraged healthy lifestyle modifications inclusive of diet and exercise.  Did review with patient and medical recommendation for 30 minutes of exercise 5 times a week.  Patient will start slow with 10 to 15 minutes 1-2 times a week.

## 2023-02-07 NOTE — Assessment & Plan Note (Signed)
History of the same with longstanding tobacco abuse ambulatory referral for low-dose CT scan placed today

## 2023-02-07 NOTE — Assessment & Plan Note (Signed)
Discussed age-appropriate immunizations and screening exams.  Did review patient's personal, surgical, social, family histories.  Did discuss vaccines will update Prevnar 20 today.  Cologuard ordered for CRC screening.  PSA ordered for prostate cancer screening.  Referred to the lung cancer screening program since he is a smoker.  Patient was given information at discharge about preventative healthcare maintenance with anticipatory guidance.

## 2023-02-08 ENCOUNTER — Other Ambulatory Visit: Payer: Self-pay | Admitting: Nurse Practitioner

## 2023-02-08 DIAGNOSIS — R351 Nocturia: Secondary | ICD-10-CM

## 2023-02-08 DIAGNOSIS — R3912 Poor urinary stream: Secondary | ICD-10-CM

## 2023-02-08 MED ORDER — TAMSULOSIN HCL 0.4 MG PO CAPS: 0.4000 mg | ORAL_CAPSULE | Freq: Every day | ORAL | 1 refills | Status: DC

## 2023-02-14 DIAGNOSIS — Z1211 Encounter for screening for malignant neoplasm of colon: Secondary | ICD-10-CM | POA: Diagnosis not present

## 2023-02-19 LAB — COLOGUARD: COLOGUARD: POSITIVE — AB

## 2023-02-21 ENCOUNTER — Encounter: Payer: Self-pay | Admitting: Nurse Practitioner

## 2023-02-21 DIAGNOSIS — R195 Other fecal abnormalities: Secondary | ICD-10-CM

## 2023-02-21 NOTE — Telephone Encounter (Signed)
-----   Message from [facility] [place] T sent at 02/21/2023 11:31 AM EDT ----- Called patient would like to be sent to [facility]. If no call received in two weeks to set up appointment will let our office know.

## 2023-02-25 ENCOUNTER — Telehealth: Payer: Self-pay

## 2023-02-25 ENCOUNTER — Other Ambulatory Visit: Payer: Self-pay

## 2023-02-25 DIAGNOSIS — Z1211 Encounter for screening for malignant neoplasm of colon: Secondary | ICD-10-CM

## 2023-02-25 DIAGNOSIS — R195 Other fecal abnormalities: Secondary | ICD-10-CM

## 2023-02-25 MED ORDER — PEG 3350-KCL-NA BICARB-NACL 420 G PO SOLR: 4000.0000 mL | Freq: Once | ORAL | 0 refills | Status: AC

## 2023-02-25 NOTE — Telephone Encounter (Signed)
Pt has positive colorgard would like to schedule colonoscopy has referral

## 2023-02-25 NOTE — Telephone Encounter (Signed)
Gastroenterology Pre-Procedure Review  Request Date: 04/07/23 Requesting Physician: Dr. Tobi Bastos  PATIENT REVIEW QUESTIONS: The patient responded to the following health history questions as indicated:    1. Are you having any GI issues? no 2. Do you have a personal history of Polyps? no 3. Do you have a family history of Colon Cancer or Polyps? no 4. Diabetes Mellitus? no 5. Joint replacements in the past 12 months?no 6. Major health problems in the past 3 months?no 7. Any artificial heart valves, MVP, or defibrillator?no    MEDICATIONS & ALLERGIES:    Patient reports the following regarding taking any anticoagulation/antiplatelet therapy:   Plavix, Coumadin, Eliquis, Xarelto, Lovenox, Pradaxa, Brilinta, or Effient? no Aspirin? no  Patient confirms/reports the following medications:  Current Outpatient Medications  Medication Sig Dispense Refill   tamsulosin (FLOMAX) 0.4 MG CAPS capsule Take 1 capsule (0.4 mg total) by mouth daily. 90 capsule 1   No current facility-administered medications for this visit.    Patient confirms/reports the following allergies:  No Known Allergies  No orders of the defined types were placed in this encounter.   AUTHORIZATION INFORMATION Primary Insurance: 1D#: Group #:  Secondary Insurance: 1D#: Group #:  SCHEDULE INFORMATION: Date: 04/07/23 Time: Location: [facility]

## 2023-03-31 ENCOUNTER — Encounter: Payer: Self-pay | Admitting: Gastroenterology

## 2023-04-07 ENCOUNTER — Other Ambulatory Visit: Payer: Self-pay

## 2023-04-07 ENCOUNTER — Ambulatory Visit: Payer: Medicare (Managed Care) | Admitting: Registered Nurse

## 2023-04-07 ENCOUNTER — Encounter
Admission: RE | Disposition: A | Discharge status: Home / Self Care | Payer: Self-pay | Source: Home / Self Care | Attending: Gastroenterology

## 2023-04-07 ENCOUNTER — Encounter: Payer: Self-pay | Admitting: Gastroenterology

## 2023-04-07 ENCOUNTER — Ambulatory Visit
Admission: RE | Admit: 2023-04-07 | Discharge: 2023-04-07 | Disposition: A | Discharge status: Home / Self Care | Payer: Medicare (Managed Care) | Attending: Gastroenterology | Admitting: Gastroenterology

## 2023-04-07 DIAGNOSIS — R195 Other fecal abnormalities: Secondary | ICD-10-CM

## 2023-04-07 DIAGNOSIS — D490 Neoplasm of unspecified behavior of digestive system: Secondary | ICD-10-CM

## 2023-04-07 DIAGNOSIS — Z1211 Encounter for screening for malignant neoplasm of colon: Secondary | ICD-10-CM | POA: Diagnosis not present

## 2023-04-07 DIAGNOSIS — D126 Benign neoplasm of colon, unspecified: Secondary | ICD-10-CM

## 2023-04-07 DIAGNOSIS — K635 Polyp of colon: Secondary | ICD-10-CM | POA: Diagnosis not present

## 2023-04-07 DIAGNOSIS — K649 Unspecified hemorrhoids: Secondary | ICD-10-CM | POA: Diagnosis not present

## 2023-04-07 DIAGNOSIS — D122 Benign neoplasm of ascending colon: Secondary | ICD-10-CM | POA: Insufficient documentation

## 2023-04-07 DIAGNOSIS — J449 Chronic obstructive pulmonary disease, unspecified: Secondary | ICD-10-CM | POA: Insufficient documentation

## 2023-04-07 DIAGNOSIS — D124 Benign neoplasm of descending colon: Secondary | ICD-10-CM | POA: Insufficient documentation

## 2023-04-07 DIAGNOSIS — F17210 Nicotine dependence, cigarettes, uncomplicated: Secondary | ICD-10-CM | POA: Insufficient documentation

## 2023-04-07 DIAGNOSIS — K621 Rectal polyp: Secondary | ICD-10-CM

## 2023-04-07 HISTORY — PX: COLONOSCOPY WITH PROPOFOL: SHX5780

## 2023-04-07 SURGERY — COLONOSCOPY WITH PROPOFOL
Anesthesia: General

## 2023-04-07 MED ORDER — SODIUM CHLORIDE 0.9 % IV SOLN: INTRAVENOUS | Status: DC

## 2023-04-07 MED ORDER — LIDOCAINE HCL (PF) 2 % IJ SOLN
INTRAMUSCULAR | Status: AC
  Filled 2023-04-07: qty 5

## 2023-04-07 MED ORDER — LIDOCAINE HCL (CARDIAC) PF 100 MG/5ML IV SOSY
PREFILLED_SYRINGE | INTRAVENOUS | Status: DC | PRN
  Administered 2023-04-07: 40 mg via INTRAVENOUS

## 2023-04-07 MED ORDER — PROPOFOL 10 MG/ML IV BOLUS
INTRAVENOUS | Status: DC | PRN
  Administered 2023-04-07: 70 mg via INTRAVENOUS

## 2023-04-07 MED ORDER — SODIUM CHLORIDE 0.9 % IV SOLN
INTRAVENOUS | Status: AC | PRN
  Administered 2023-04-07: 3 mL via INTRAMUSCULAR

## 2023-04-07 MED ORDER — PROPOFOL 500 MG/50ML IV EMUL
INTRAVENOUS | Status: DC | PRN
  Administered 2023-04-07: 125 ug/kg/min via INTRAVENOUS

## 2023-04-07 MED ORDER — PROPOFOL 10 MG/ML IV BOLUS
INTRAVENOUS | Status: AC
  Filled 2023-04-07: qty 40

## 2023-04-07 NOTE — Plan of Care (Signed)
[facility] Tonsillectomy/Adenoidectomy, Postoperative PEDS care plan entered in error.

## 2023-04-07 NOTE — Anesthesia Preprocedure Evaluation (Signed)
Anesthesia Evaluation  Patient identified by MRN, date of birth, ID band Patient awake    Reviewed: Allergy & Precautions, NPO status , Patient's Chart, lab work & pertinent test results  Airway Mallampati: III  TM Distance: >3 FB Neck ROM: full    Dental  (+) Poor Dentition, Missing   Pulmonary neg shortness of breath, COPD, Current Smoker and Patient abstained from smoking.   Pulmonary exam normal        Cardiovascular negative cardio ROS Normal cardiovascular exam     Neuro/Psych  PSYCHIATRIC DISORDERS      negative neurological ROS     GI/Hepatic negative GI ROS, Neg liver ROS,,,  Endo/Other  negative endocrine ROS    Renal/GU negative Renal ROS  negative genitourinary   Musculoskeletal   Abdominal   Peds  Hematology negative hematology ROS (+)   Anesthesia Other Findings Past Medical History: No date: Anxiety No date: Chronic hand pain, right No date: Depression No date: Dyspnea 07/01/2013: Pulmonary nodules No date: Umbilical hernia without obstruction and without gangrene  Past Surgical History: 12/23/2022: hernia repair No date: NO PAST SURGERIES  BMI    Body Mass Index: 31.57 kg/m      Reproductive/Obstetrics negative OB ROS                             Anesthesia Physical Anesthesia Plan  ASA: 3  Anesthesia Plan: General   Post-op Pain Management:    Induction: Intravenous  PONV Risk Score and Plan: Propofol infusion and TIVA  Airway Management Planned: Natural Airway and Nasal Cannula  Additional Equipment:   Intra-op Plan:   Post-operative Plan:   Informed Consent: I have reviewed the patients History and Physical, chart, labs and discussed the procedure including the risks, benefits and alternatives for the proposed anesthesia with the patient or authorized representative who has indicated his/her understanding and acceptance.     Dental Advisory  Given  Plan Discussed with: Anesthesiologist, CRNA and Surgeon  Anesthesia Plan Comments: (Patient consented for risks of anesthesia including but not limited to:  - adverse reactions to medications - risk of airway placement if required - damage to eyes, teeth, lips or other oral mucosa - nerve damage due to positioning  - sore throat or hoarseness - Damage to heart, brain, nerves, lungs, other parts of body or loss of life  Patient voiced understanding and assent.)       Anesthesia Quick Evaluation

## 2023-04-07 NOTE — H&P (Signed)
Wyline Mood, MD [address], [address], [place], [place], [address] [address], [address], [place], [place], [address] Phone: [phone]  Fax: [phone]  Primary Care Physician:  Eden Emms, NP   Pre-Procedure History & Physical: HPI:  Terry [name] is a 67 y.o. male is here for an colonoscopy.   Past Medical History:  Diagnosis Date   Anxiety    Chronic hand pain, right    Depression    Dyspnea    Pulmonary nodules 07/01/2013   Umbilical hernia without obstruction and without gangrene     Past Surgical History:  Procedure Laterality Date   hernia repair  12/23/2022   NO PAST SURGERIES      Prior to Admission medications   Medication Sig Start Date End Date Taking? Authorizing Provider  tamsulosin (FLOMAX) 0.4 MG CAPS capsule Take 1 capsule (0.4 mg total) by mouth daily. 02/08/23   Eden Emms, NP    Allergies as of 02/25/2023   (No Known Allergies)    Family History  Problem Relation Age of Onset   Diabetes Mother    Arthritis Mother    Cancer Maternal Grandmother        unsure, ?lung (smoker)    Social History   Socioeconomic History   Marital status: Married    Spouse name: Steward Drone   Number of children: 3   Years of education: Not on file   Highest education level: Not on file  Occupational History   Not on file  Tobacco Use   Smoking status: Every Day    Current packs/day: 1.50    Average packs/day: 1.5 packs/day for 49.8 years (74.7 ttl pk-yrs)    Types: Cigarettes    Start date: 06/14/1973   Smokeless tobacco: Never  Vaping Use   Vaping status: Never Used  Substance and Sexual Activity   Alcohol use: Yes    Alcohol/week: 2.0 standard drinks of alcohol    Types: 2 Cans of beer per week    Comment: occasional   Drug use: Yes    Types: Marijuana    Comment: daily   Sexual activity: Yes  Other Topics Concern   Not on file  Social History Narrative   Caffeine: 3 cups coffee/day, 4 sodas/dayLives with wife, 1  dogOccupation: roll off truck driverEdu: HSActivity: no regular exercise, active some at workDiet: some water, mainly sodas, seldom fruits/vegetables, red meat 4-5 times/wk, no fish      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 General:   Alert,  pleasant and cooperative in NAD Head:  Normocephalic and atraumatic. Neck:  Supple; no masses or thyromegaly. Lungs:  Clear throughout to auscultation, normal respiratory effort.    Heart:  +S1, +S2, Regular rate and rhythm, No edema. Abdomen:  Soft, nontender and nondistended. Normal bowel sounds, without guarding, and without rebound.   Neurologic:  Alert and  oriented x4;  grossly normal neurologically.  Impression/Plan: Terry [name] is here for an colonoscopy to be performed for Screening colonoscopy,positive cologaurd Risks, benefits, limitations, and alternatives regarding  colonoscopy  have been reviewed with the patient.  Questions have been answered.  All parties agreeable.   Wyline Mood, MD  04/07/2023, 9:41 AM

## 2023-04-07 NOTE — Transfer of Care (Signed)
Immediate Anesthesia Transfer of Care Note  Patient: Terry [name]  Procedure(s) Performed: COLONOSCOPY WITH PROPOFOL  Patient Location: PACU  Anesthesia Type:General  Level of Consciousness: drowsy  Airway & Oxygen Therapy: Patient Spontanous Breathing  Post-op Assessment: Report given to RN and Post -op Vital signs reviewed and stable  Post vital signs: stable  Last Vitals:  Vitals Value Taken Time  BP 95/76 04/07/23 1057  Temp 36.2 C 04/07/23 1056  Pulse 86 04/07/23 1057  Resp 13 04/07/23 1057  SpO2 94 % 04/07/23 1057  Vitals shown include unfiled device data.  Last Pain:  Vitals:   04/07/23 1056  TempSrc: Temporal  PainSc: Asleep         Complications: No notable events documented.

## 2023-04-07 NOTE — Op Note (Signed)
[facility] Patient Name: Terry [name] Procedure Date: 04/07/2023 10:20 AM MRN: [id number] Account #: [id number] Date of Birth: 1955/07/08 Admit Type: Outpatient Age: 67 Room: [facility] ENDO ROOM 1 Gender: Male Note Status: Finalized Instrument Name: Prentice Docker [id number] Procedure:             Colonoscopy Indications:           Screening for colorectal malignant neoplasm due to                         positive Cologuard test Providers:             Wyline Mood MD, MD Referring MD:          Genene Churn. Toney Reil (Referring MD) Medicines:             Monitored Anesthesia Care Complications:         No immediate complications. Procedure:             Pre-Anesthesia Assessment:                        - Prior to the procedure, a History and Physical was                         performed, and patient medications, allergies and                         sensitivities were reviewed. The patient's tolerance                         of previous anesthesia was reviewed.                        - The risks and benefits of the procedure and the                         sedation options and risks were discussed with the                         patient. All questions were answered and informed                         consent was obtained.                        - ASA Grade Assessment: II - A patient with mild                         systemic disease.                        After obtaining informed consent, the colonoscope was                         passed under direct vision. Throughout the procedure,                         the patient's blood pressure, pulse, and oxygen                         saturations were monitored  continuously. The                         Colonoscope was introduced through the anus and                         advanced to the the cecum, identified by the                         appendiceal orifice. The colonoscopy was performed                          with ease. The patient tolerated the procedure well.                         The quality of the bowel preparation was good.                         Anatomical landmarks were photographed. Findings:      The perianal and digital rectal examinations were normal.      A 7 mm polyp was found in the descending colon. The polyp was sessile.       The polyp was removed with a cold snare. Resection and retrieval were       complete.      A 10 mm polyp was found in the descending colon. The polyp was       semi-pedunculated. The polyp was removed with a hot snare. Resection and       retrieval were complete.      Three sessile polyps were found in the ascending colon. The polyps were       8 to 10 mm in size. These polyps were removed with a hot snare.       Resection and retrieval were complete.      A 25 mm polyp was found in the ascending colon. The polyp was       semi-pedunculated. The polyp was removed with a saline injection-lift       technique using a hot snare. Resection and retrieval were complete. To       prevent bleeding post-intervention, one hemostatic clip was successfully       placed. Clip manufacturer: [employer]. There was no bleeding at       the end of the procedure.      A 30 mm polypoid lesion was found in the distal rectum. The lesion was       semi-pedunculated. No bleeding was present. over area of hemorroids felt       unsafe to resect      The exam was otherwise without abnormality. Impression:            - One 7 mm polyp in the descending colon, removed with                         a cold snare. Resected and retrieved.                        - One 10 mm polyp in the descending colon, removed                         with a hot snare. Resected and  retrieved.                        - Three 8 to 10 mm polyps in the ascending colon,                         removed with a hot snare. Resected and retrieved.                        - One 25 mm polyp in the ascending  colon, removed                         using injection-lift and a hot snare. Resected and                         retrieved. Clip was placed. Clip manufacturer: [employer].                        - Polypoid lesion in the distal rectum.                        - The examination was otherwise normal. Recommendation:        - Discharge patient to home.                        - Await pathology results.                        - Refer to Dr Angelena Form for trans anal muscosal                         resection of large distal rectal polyp vs mass close                         to anus , repeat colonoscopy will be based on                         pathology report of the rectal growth. F/u with Inetta Fermo                         garrett in 8-12 weeks Procedure Code(s):     --- Professional ---                        [phone], Colonoscopy, flexible; with removal of                         tumor(s), polyp(s), or other lesion(s) by snare                         technique                        [id number], Colonoscopy, flexible; with directed submucosal                         injection(s), any substance Diagnosis Code(s):     ---  Professional ---                        Z12.11, Encounter for screening for malignant neoplasm                         of colon                        R19.5, Other fecal abnormalities                        D12.4, Benign neoplasm of descending colon                        D12.2, Benign neoplasm of ascending colon                        D49.0, Neoplasm of unspecified behavior of digestive                         system CPT copyright 2022 American Medical Association. All rights reserved. The codes documented in this report are preliminary and upon coder review may  be revised to meet current compliance requirements. Wyline Mood, MD Wyline Mood MD, MD 04/07/2023 10:58:25 AM This report has been signed electronically. Number of Addenda: 0 Note Initiated On:  04/07/2023 10:20 AM Scope Withdrawal Time: 0 hours 16 minutes 20 seconds  Total Procedure Duration: 0 hours 21 minutes 19 seconds  Estimated Blood Loss:  Estimated blood loss: none.      [facility]

## 2023-04-07 NOTE — Anesthesia Postprocedure Evaluation (Signed)
Anesthesia Post Note  Patient: Terry [name]  Procedure(s) Performed: COLONOSCOPY WITH PROPOFOL  Patient location during evaluation: Endoscopy Anesthesia Type: General Level of consciousness: awake and alert Pain management: pain level controlled Vital Signs Assessment: post-procedure vital signs reviewed and stable Respiratory status: spontaneous breathing, nonlabored ventilation, respiratory function stable and patient connected to nasal cannula oxygen Cardiovascular status: blood pressure returned to baseline and stable Postop Assessment: no apparent nausea or vomiting Anesthetic complications: no   No notable events documented.   Last Vitals:  Vitals:   04/07/23 1106 04/07/23 1116  BP: 111/85 139/85  Pulse: 82 67  Resp: 16 13  Temp: (!) 36.2 C   SpO2: 94% 97%    Last Pain:  Vitals:   04/07/23 1106  TempSrc:   PainSc: 0-No pain                 Cleda Mccreedy Emilya Justen

## 2023-04-08 LAB — SURGICAL PATHOLOGY

## 2023-05-10 ENCOUNTER — Ambulatory Visit (INDEPENDENT_AMBULATORY_CARE_PROVIDER_SITE_OTHER): Payer: Medicare (Managed Care) | Admitting: Nurse Practitioner

## 2023-05-10 VITALS — BP 160/98 | HR 72 | Temp 97.9°F | Ht 72.0 in | Wt 238.2 lb

## 2023-05-10 DIAGNOSIS — I10 Essential (primary) hypertension: Secondary | ICD-10-CM | POA: Diagnosis not present

## 2023-05-10 MED ORDER — AMLODIPINE BESYLATE 5 MG PO TABS: 5.0000 mg | ORAL_TABLET | Freq: Every day | ORAL | 0 refills | Status: DC

## 2023-05-10 NOTE — Assessment & Plan Note (Signed)
Patient with several elevated blood pressures.  On initial and recheck today will start patient on amlodipine 5 mg daily encourage patient check blood pressure 2 times per week at home encouraged him to bring blood pressure cuff to next office visit follow-up 6 weeks

## 2023-05-10 NOTE — Patient Instructions (Signed)
Nice to see you today I have sent in blood pressure medication to the pharmacy  Check your blood pressure 2 times a week for me  Bring your blood pressure cuff to your next office visit with me Follow up with me in 6 weeks, sooner if you need me

## 2023-05-10 NOTE — Progress Notes (Signed)
   Established Patient Office Visit  Subjective   Patient ID: Terry [name], male    DOB: [birth year]  Age: 67 y.o. MRN: [id number]  Chief Complaint  Patient presents with   Follow-up    Recheck BP    HPI  HTN: Patient was seen by me on 02/07/2023 for establish care and preventative health care.  Patient's blood pressure was elevated that juncture.  I will plan to give clonidine oh history of the same patient wanted time to work on lifestyle modifications.  He is here for recheck. Does have amachine at home.  Patient has not been checking blood pressure at home.  States he does have family history of blood pressure.  Of note patient did have a colonoscopy last month blood pressure within normal limits.  He had questions in regards to pathology.  He does have an appointment with gastro in December encouraged to keep that appointment    Review of Systems  Constitutional:  Negative for chills and fever.  Respiratory:  Negative for shortness of breath.   Cardiovascular:  Negative for chest pain.  Neurological:  Negative for headaches.      Objective:     BP (!) 160/98   Pulse 72   Temp 97.9 F (36.6 C) (Oral)   Ht 6' (1.829 m)   Wt 238 lb 3.2 oz (108 kg)   SpO2 96%   BMI 32.31 kg/m  BP Readings from Last 3 Encounters:  05/10/23 (!) 160/98  04/07/23 139/85  02/07/23 (!) 170/94   Wt Readings from Last 3 Encounters:  05/10/23 238 lb 3.2 oz (108 kg)  04/07/23 232 lb 12.8 oz (105.6 kg)  02/07/23 243 lb (110.2 kg)   SpO2 Readings from Last 3 Encounters:  05/10/23 96%  04/07/23 97%  02/07/23 97%      Physical Exam Vitals and nursing note reviewed.  Constitutional:      Appearance: Normal appearance.  Cardiovascular:     Rate and Rhythm: Normal rate and regular rhythm.     Heart sounds: Normal heart sounds.  Pulmonary:     Effort: Pulmonary effort is normal.     Breath sounds: Normal breath sounds.  Neurological:     Mental Status: He is alert.      No  results found for any visits on 05/10/23.    The 10-year ASCVD risk score (Arnett DK, et al., 2019) is: 34%    Assessment & Plan:   Problem List Items Addressed This Visit       Cardiovascular and Mediastinum   Primary hypertension - Primary    Patient with several elevated blood pressures.  On initial and recheck today will start patient on amlodipine 5 mg daily encourage patient check blood pressure 2 times per week at home encouraged him to bring blood pressure cuff to next office visit follow-up 6 weeks      Relevant Medications   amLODipine (NORVASC) 5 MG tablet    Return in about 6 weeks (around 06/21/2023) for BP recheck.    Audria Nine, NP

## 2023-05-17 DIAGNOSIS — K6289 Other specified diseases of anus and rectum: Secondary | ICD-10-CM | POA: Diagnosis not present

## 2023-05-30 ENCOUNTER — Ambulatory Visit: Payer: Medicare (Managed Care) | Admitting: Physician Assistant

## 2023-06-20 ENCOUNTER — Encounter (HOSPITAL_BASED_OUTPATIENT_CLINIC_OR_DEPARTMENT_OTHER): Payer: Self-pay | Admitting: Surgery

## 2023-06-20 NOTE — Progress Notes (Signed)
 Spoke w/ via phone for pre-op interview--- Terry [name] needs dos---- ISTAT per anesthesia        [name] results------Current EKG in Epic dated 12/24/22. COVID test -----patient states asymptomatic no test needed Arrive at -------1200 NPO after MN NO Solid Food.  Clear liquids from MN until---1100 Med rec completed Medications to take morning of surgery -----Norvasc  Diabetic medication ----- Patient instructed no nail polish to be worn day of surgery Patient instructed to bring photo id and insurance card day of surgery Patient aware to have Driver (ride ) / caregiver    for 24 hours after surgery - Wife Terry [name] Patient Special Instructions ----- Pre-Op special Instructions ----- Patient verbalized understanding of instructions that were given at this phone interview. Patient denies chest pain, sob, fever, cough at the interview.

## 2023-06-21 ENCOUNTER — Ambulatory Visit: Payer: Medicare (Managed Care) | Admitting: Nurse Practitioner

## 2023-06-24 ENCOUNTER — Ambulatory Visit (HOSPITAL_BASED_OUTPATIENT_CLINIC_OR_DEPARTMENT_OTHER)
Admission: RE | Admit: 2023-06-24 | Discharge: 2023-06-24 | Disposition: A | Discharge status: Home / Self Care | Payer: Medicare (Managed Care) | Attending: Surgery | Admitting: Surgery

## 2023-06-24 ENCOUNTER — Encounter (HOSPITAL_BASED_OUTPATIENT_CLINIC_OR_DEPARTMENT_OTHER): Payer: Self-pay | Admitting: Surgery

## 2023-06-24 ENCOUNTER — Ambulatory Visit (HOSPITAL_BASED_OUTPATIENT_CLINIC_OR_DEPARTMENT_OTHER): Payer: Medicare (Managed Care) | Admitting: Certified Registered"

## 2023-06-24 ENCOUNTER — Other Ambulatory Visit: Payer: Self-pay

## 2023-06-24 ENCOUNTER — Encounter (HOSPITAL_BASED_OUTPATIENT_CLINIC_OR_DEPARTMENT_OTHER)
Admission: RE | Disposition: A | Discharge status: Home / Self Care | Payer: Self-pay | Source: Home / Self Care | Attending: Surgery

## 2023-06-24 DIAGNOSIS — D128 Benign neoplasm of rectum: Secondary | ICD-10-CM | POA: Insufficient documentation

## 2023-06-24 DIAGNOSIS — F1290 Cannabis use, unspecified, uncomplicated: Secondary | ICD-10-CM | POA: Diagnosis not present

## 2023-06-24 DIAGNOSIS — K6289 Other specified diseases of anus and rectum: Secondary | ICD-10-CM | POA: Diagnosis not present

## 2023-06-24 DIAGNOSIS — I10 Essential (primary) hypertension: Secondary | ICD-10-CM | POA: Insufficient documentation

## 2023-06-24 DIAGNOSIS — R7303 Prediabetes: Secondary | ICD-10-CM | POA: Diagnosis not present

## 2023-06-24 DIAGNOSIS — F17210 Nicotine dependence, cigarettes, uncomplicated: Secondary | ICD-10-CM | POA: Diagnosis not present

## 2023-06-24 HISTORY — DX: Prediabetes: R73.03

## 2023-06-24 HISTORY — DX: Essential (primary) hypertension: I10

## 2023-06-24 HISTORY — PX: TRANSANAL EXCISION OF RECTAL MASS: SHX6134

## 2023-06-24 LAB — POCT I-STAT, CHEM 8
BUN: 10 mg/dL (ref 8–23)
Calcium, Ion: 1.18 mmol/L (ref 1.15–1.40)
Chloride: 101 mmol/L (ref 98–111)
Creatinine, Ser: 0.9 mg/dL (ref 0.61–1.24)
Glucose, Bld: 105 mg/dL — ABNORMAL HIGH (ref 70–99)
HCT: 45 % (ref 39.0–52.0)
Hemoglobin: 15.3 g/dL (ref 13.0–17.0)
Potassium: 3.9 mmol/L (ref 3.5–5.1)
Sodium: 137 mmol/L (ref 135–145)
TCO2: 24 mmol/L (ref 22–32)

## 2023-06-24 SURGERY — EXCISION, MASS, RECTUM, ANAL APPROACH
Anesthesia: General

## 2023-06-24 MED ORDER — ONDANSETRON HCL 4 MG/2ML IJ SOLN
INTRAMUSCULAR | Status: AC
  Filled 2023-06-24: qty 2

## 2023-06-24 MED ORDER — ACETAMINOPHEN 10 MG/ML IV SOLN: 1000.0000 mg | Freq: Once | INTRAVENOUS | Status: DC | PRN

## 2023-06-24 MED ORDER — ROCURONIUM BROMIDE 10 MG/ML (PF) SYRINGE
PREFILLED_SYRINGE | INTRAVENOUS | Status: AC
  Filled 2023-06-24: qty 10

## 2023-06-24 MED ORDER — TRAMADOL HCL 50 MG PO TABS: 50.0000 mg | ORAL_TABLET | Freq: Four times a day (QID) | ORAL | 0 refills | Status: AC | PRN

## 2023-06-24 MED ORDER — PROPOFOL 10 MG/ML IV BOLUS
INTRAVENOUS | Status: DC | PRN
  Administered 2023-06-24: 180 mg via INTRAVENOUS

## 2023-06-24 MED ORDER — ACETAMINOPHEN 10 MG/ML IV SOLN
INTRAVENOUS | Status: AC
  Filled 2023-06-24: qty 100

## 2023-06-24 MED ORDER — DEXAMETHASONE SODIUM PHOSPHATE 10 MG/ML IJ SOLN
INTRAMUSCULAR | Status: AC
  Filled 2023-06-24: qty 1

## 2023-06-24 MED ORDER — GLYCOPYRROLATE 0.2 MG/ML IJ SOLN
INTRAMUSCULAR | Status: DC | PRN
  Administered 2023-06-24: .2 mg via INTRAVENOUS

## 2023-06-24 MED ORDER — LIDOCAINE HCL (PF) 2 % IJ SOLN
INTRAMUSCULAR | Status: AC
  Filled 2023-06-24: qty 5

## 2023-06-24 MED ORDER — BUPIVACAINE LIPOSOME 1.3 % IJ SUSP: 20.0000 mL | Freq: Once | INTRAMUSCULAR | Status: DC

## 2023-06-24 MED ORDER — SODIUM CHLORIDE 0.9 % IV SOLN
2.0000 g | INTRAVENOUS | Status: AC
  Administered 2023-06-24: 2 g via INTRAVENOUS

## 2023-06-24 MED ORDER — DEXAMETHASONE SODIUM PHOSPHATE 10 MG/ML IJ SOLN
INTRAMUSCULAR | Status: DC | PRN
  Administered 2023-06-24: 10 mg via INTRAVENOUS

## 2023-06-24 MED ORDER — OXYCODONE HCL 5 MG PO TABS: 5.0000 mg | ORAL_TABLET | Freq: Once | ORAL | Status: DC | PRN

## 2023-06-24 MED ORDER — BUPIVACAINE-EPINEPHRINE 0.5% -1:200000 IJ SOLN
INTRAMUSCULAR | Status: DC | PRN
  Administered 2023-06-24: 30 mL

## 2023-06-24 MED ORDER — SODIUM CHLORIDE 0.9 % IV SOLN: INTRAVENOUS | Status: DC

## 2023-06-24 MED ORDER — MIDAZOLAM HCL 5 MG/5ML IJ SOLN
INTRAMUSCULAR | Status: DC | PRN
  Administered 2023-06-24: 2 mg via INTRAVENOUS

## 2023-06-24 MED ORDER — FENTANYL CITRATE (PF) 100 MCG/2ML IJ SOLN
INTRAMUSCULAR | Status: DC | PRN
  Administered 2023-06-24: 100 ug via INTRAVENOUS

## 2023-06-24 MED ORDER — ACETAMINOPHEN 10 MG/ML IV SOLN
INTRAVENOUS | Status: DC | PRN
  Administered 2023-06-24: 1000 mg via INTRAVENOUS

## 2023-06-24 MED ORDER — BUPIVACAINE LIPOSOME 1.3 % IJ SUSP
INTRAMUSCULAR | Status: DC | PRN
  Administered 2023-06-24: 20 mL

## 2023-06-24 MED ORDER — LIDOCAINE 2% (20 MG/ML) 5 ML SYRINGE
INTRAMUSCULAR | Status: DC | PRN
  Administered 2023-06-24: 100 mg via INTRAVENOUS

## 2023-06-24 MED ORDER — ROCURONIUM BROMIDE 10 MG/ML (PF) SYRINGE
PREFILLED_SYRINGE | INTRAVENOUS | Status: DC | PRN
  Administered 2023-06-24: 60 mg via INTRAVENOUS

## 2023-06-24 MED ORDER — DEXMEDETOMIDINE HCL IN NACL 80 MCG/20ML IV SOLN
INTRAVENOUS | Status: DC | PRN
  Administered 2023-06-24 (×2): 4 ug via INTRAVENOUS

## 2023-06-24 MED ORDER — ACETAMINOPHEN 500 MG PO TABS
ORAL_TABLET | ORAL | Status: AC
  Filled 2023-06-24: qty 2

## 2023-06-24 MED ORDER — FENTANYL CITRATE (PF) 100 MCG/2ML IJ SOLN
INTRAMUSCULAR | Status: AC
  Filled 2023-06-24: qty 2

## 2023-06-24 MED ORDER — DROPERIDOL 2.5 MG/ML IJ SOLN: 0.6250 mg | Freq: Once | INTRAMUSCULAR | Status: DC | PRN

## 2023-06-24 MED ORDER — PROPOFOL 10 MG/ML IV BOLUS
INTRAVENOUS | Status: AC
  Filled 2023-06-24: qty 20

## 2023-06-24 MED ORDER — ACETAMINOPHEN 160 MG/5ML PO SOLN: 325.0000 mg | ORAL | Status: DC | PRN

## 2023-06-24 MED ORDER — SUGAMMADEX SODIUM 200 MG/2ML IV SOLN
INTRAVENOUS | Status: DC | PRN
  Administered 2023-06-24: 200 mg via INTRAVENOUS

## 2023-06-24 MED ORDER — GLYCOPYRROLATE PF 0.2 MG/ML IJ SOSY
PREFILLED_SYRINGE | INTRAMUSCULAR | Status: AC
  Filled 2023-06-24: qty 1

## 2023-06-24 MED ORDER — SODIUM CHLORIDE 0.9 % IV SOLN
INTRAVENOUS | Status: AC
  Filled 2023-06-24: qty 2

## 2023-06-24 MED ORDER — MIDAZOLAM HCL 2 MG/2ML IJ SOLN
INTRAMUSCULAR | Status: AC
Start: 2023-06-24 — End: ?
  Filled 2023-06-24: qty 2

## 2023-06-24 MED ORDER — OXYCODONE HCL 5 MG/5ML PO SOLN: 5.0000 mg | Freq: Once | ORAL | Status: DC | PRN

## 2023-06-24 MED ORDER — ONDANSETRON HCL 4 MG/2ML IJ SOLN
INTRAMUSCULAR | Status: DC | PRN
  Administered 2023-06-24: 4 mg via INTRAVENOUS

## 2023-06-24 MED ORDER — ACETAMINOPHEN 500 MG PO TABS: 1000.0000 mg | ORAL_TABLET | ORAL | Status: DC

## 2023-06-24 MED ORDER — FENTANYL CITRATE (PF) 100 MCG/2ML IJ SOLN: 25.0000 ug | INTRAMUSCULAR | Status: DC | PRN

## 2023-06-24 MED ORDER — ACETAMINOPHEN 325 MG PO TABS: 325.0000 mg | ORAL_TABLET | ORAL | Status: DC | PRN

## 2023-06-24 SURGICAL SUPPLY — 41 items
BENZOIN TINCTURE PRP APPL 2/3 (GAUZE/BANDAGES/DRESSINGS) ×1 IMPLANT
BLADE EXTENDED COATED 6.5IN (ELECTRODE) IMPLANT
BLADE SURG 10 STRL SS (BLADE) ×1 IMPLANT
BRIEF MESH DISP LRG (UNDERPADS AND DIAPERS) ×1 IMPLANT
COVER BACK TABLE 60X90IN (DRAPES) ×1 IMPLANT
COVER MAYO STAND STRL (DRAPES) ×1 IMPLANT
DRAPE HYSTEROSCOPY (MISCELLANEOUS) IMPLANT
DRAPE LAPAROTOMY 100X72 PEDS (DRAPES) ×1 IMPLANT
DRAPE SHEET LG 3/4 BI-LAMINATE (DRAPES) IMPLANT
DRAPE UTILITY XL STRL (DRAPES) ×1 IMPLANT
ELECT REM PT RETURN 9FT ADLT (ELECTROSURGICAL) ×1
ELECTRODE REM PT RTRN 9FT ADLT (ELECTROSURGICAL) ×1 IMPLANT
GAUZE 4X4 16PLY ~~LOC~~+RFID DBL (SPONGE) ×1 IMPLANT
GAUZE PAD ABD 8X10 STRL (GAUZE/BANDAGES/DRESSINGS) ×1 IMPLANT
GAUZE SPONGE 4X4 12PLY STRL (GAUZE/BANDAGES/DRESSINGS) IMPLANT
GLOVE BIO SURGEON STRL SZ 6.5 (GLOVE) ×1 IMPLANT
GLOVE INDICATOR 6.5 STRL GRN (GLOVE) ×1 IMPLANT
GOWN STRL REUS W/TWL XL LVL3 (GOWN DISPOSABLE) ×1 IMPLANT
KIT SIGMOIDOSCOPE (SET/KITS/TRAYS/PACK) IMPLANT
KIT TURNOVER CYSTO (KITS) ×1 IMPLANT
LEGGING LITHOTOMY PAIR STRL (DRAPES) IMPLANT
LIGASURE 7.4 SM JAW OPEN (ELECTROSURGICAL) IMPLANT
NDL HYPO 22X1.5 SAFETY MO (MISCELLANEOUS) ×1 IMPLANT
NDL SAFETY ECLIPSE 18X1.5 (NEEDLE) IMPLANT
NEEDLE HYPO 22X1.5 SAFETY MO (MISCELLANEOUS) ×1
NS IRRIG 500ML POUR BTL (IV SOLUTION) ×1 IMPLANT
PACK BASIN DAY SURGERY FS (CUSTOM PROCEDURE TRAY) ×1 IMPLANT
PAD ARMBOARD 7.5X6 YLW CONV (MISCELLANEOUS) IMPLANT
PENCIL SMOKE EVACUATOR (MISCELLANEOUS) ×1 IMPLANT
SLEEVE SCD COMPRESS KNEE MED (STOCKING) ×1 IMPLANT
SPIKE FLUID TRANSFER (MISCELLANEOUS) ×1 IMPLANT
SPONGE SURGIFOAM ABS GEL 12-7 (HEMOSTASIS) IMPLANT
SUT CHROMIC 2 0 SH (SUTURE) IMPLANT
SUT CHROMIC 3 0 SH 27 (SUTURE) IMPLANT
SUT VIC AB 2-0 SH 27XBRD (SUTURE) IMPLANT
SUT VIC AB 3-0 SH 27X BRD (SUTURE) IMPLANT
SYR CONTROL 10ML LL (SYRINGE) ×1 IMPLANT
TOWEL OR 17X24 6PK STRL BLUE (TOWEL DISPOSABLE) ×1 IMPLANT
TRAY DSU PREP LF (CUSTOM PROCEDURE TRAY) ×1 IMPLANT
TUBE CONNECTING 12X1/4 (SUCTIONS) ×1 IMPLANT
YANKAUER SUCT BULB TIP NO VENT (SUCTIONS) ×1 IMPLANT

## 2023-06-24 NOTE — H&P (Signed)
 CC: Here today for surgery  HPI: Terry [name] is an 68 y.o. male with history of HTN, whom is seen in the office today as a referral by Dr. Therisa for evaluation of distal rectal polypoid mass.   Colonoscopy 04/07/23 - One 7 mm polyp in the descending colon, removed with a cold snare. Resected and retrieved. - One 10 mm polyp in the descending colon, removed with a hot snare. Resected and retrieved. - Three 8 to 10 mm polyps in the ascending colon, removed with a hot snare. Resected and retrieved. - One 25 mm polyp in the ascending colon, removed using injection-lift and a hot snare. Resected and retrieved. Clip was placed. Clip manufacturer: [employer]. - Polypoid lesion in the distal rectum. - The examination was otherwise normal.  PATH 1. Descending Colon Polyp, Hot (1) Cold (1) snare :  - TUBULAR ADENOMA, FRAGMENTS.   2. Ascending Colon Polyp, Hot snare x4 :  - TUBULOVILLOUS/TUBULAR ADENOMA, FRAGMENTS.   Referred to see us  for evaluation of this distal rectal polypoid lesion. Does not appear this has been biopsied.  He reports that the colonoscopy was done for screening purposes. He also reports this was his first. He does note that over the last few years he has noticed intermittent/rare bright red blood with the stool. Generally not present. Denies any anorectal pain. Denies any anorectal surgeries or procedures.  PMH: HTN  PSH: Robotic umbilical hernia repair with Leonidas Pierre at [facility]  FHx: Denies any known family history of colorectal, breast, endometrial or ovarian cancer  Social Hx: Smokes 1 to 1-1/2 packs/day. Denies EtOH use. Does report marijuana use. He is here today with his wife.   He denies any changes in health or health history since we met in the office. No new medications/allergies. He states he is ready for surgery today.  Past Medical History:  Diagnosis Date   Anxiety    Chronic hand pain, right    Depression    Dyspnea     Hypertension    Pre-diabetes    Pulmonary nodules 07/01/2013   Umbilical hernia without obstruction and without gangrene     Past Surgical History:  Procedure Laterality Date   COLONOSCOPY WITH PROPOFOL  N/A 04/07/2023   Procedure: COLONOSCOPY WITH PROPOFOL ;  Surgeon: Therisa Bi, MD;  Location: [facility] ENDOSCOPY;  Service: Gastroenterology;  Laterality: N/A;   hernia repair  12/23/2022   NO PAST SURGERIES      Family History  Problem Relation Age of Onset   Diabetes Mother    Arthritis Mother    Cancer Maternal Grandmother        unsure, ?lung (smoker)    Social:  reports that he has been smoking cigarettes. He started smoking about 50 years ago. He has a 75 pack-year smoking history. He has never used smokeless tobacco. He reports current alcohol use of about 2.0 standard drinks of alcohol per week. He reports that he does not currently use drugs after having used the following drugs: Marijuana.  Allergies: No Known Allergies  Medications: I have reviewed the patient's current medications.  Results for orders placed or performed during the hospital encounter of 06/24/23 (from the past 48 hours)  I-STAT, chem 8     Status: Abnormal   Collection Time: 06/24/23 12:19 PM  Result Value Ref Range   Sodium 137 135 - 145 mmol/L   Potassium 3.9 3.5 - 5.1 mmol/L   Chloride 101 98 - 111 mmol/L   BUN 10 8 -  23 mg/dL   Creatinine, Ser 9.09 0.61 - 1.24 mg/dL   Glucose, Bld 894 (H) 70 - 99 mg/dL    Comment: Glucose reference range applies only to samples taken after fasting for at least 8 hours.   Calcium, Ion 1.18 1.15 - 1.40 mmol/L   TCO2 24 22 - 32 mmol/L   Hemoglobin 15.3 13.0 - 17.0 g/dL   HCT 54.9 60.9 - 47.9 %    No results found.   PE Blood pressure 139/89, pulse 67, temperature 97.6 F (36.4 C), temperature source Oral, resp. rate 18, height 6' (1.829 m), weight 107.6 kg, SpO2 98%. Constitutional: NAD; conversant Eyes: Moist conjunctiva; no lid lag; anicteric Lungs:  Normal respiratory effort CV: RRR Psychiatric: Appropriate affect  Results for orders placed or performed during the hospital encounter of 06/24/23 (from the past 48 hours)  I-STAT, chem 8     Status: Abnormal   Collection Time: 06/24/23 12:19 PM  Result Value Ref Range   Sodium 137 135 - 145 mmol/L   Potassium 3.9 3.5 - 5.1 mmol/L   Chloride 101 98 - 111 mmol/L   BUN 10 8 - 23 mg/dL   Creatinine, Ser 9.09 0.61 - 1.24 mg/dL   Glucose, Bld 894 (H) 70 - 99 mg/dL    Comment: Glucose reference range applies only to samples taken after fasting for at least 8 hours.   Calcium, Ion 1.18 1.15 - 1.40 mmol/L   TCO2 24 22 - 32 mmol/L   Hemoglobin 15.3 13.0 - 17.0 g/dL   HCT 54.9 60.9 - 47.9 %    No results found.  A/P: Terry [name] is an 68 y.o. male with hx of HTN here for evaluation of endoscopically unresectable distal rectal polypoid mass  -Based on endoscopic photos, this was visible within the distal rectum on retroflexion. Therefore, will likely be amenable to transanal excision. It likely is too distal for us  to do any sort of transanal minimally invasive surgery as the GelPort will likely cover part of this lesion.  -The anatomy and physiology of the anal canal was discussed with the patient with associated pictures. The pathophysiology of rectal polyps was discussed at length with associated pictures and illustrations. -We have reviewed options going forward including further observation vs surgery -transanal excision of distal rectal polypoid mass; flexible sigmoidoscopy, diagnostic, to localize lesion and determine optimal positioning -The planned procedure, material risks (including, but not limited to, pain, bleeding, infection, scarring, need for blood transfusion, damage to anal sphincter, incontinence of gas and/or stool, need for additional procedures, anal stenosis, rare cases of pelvic sepsis which in severe cases may require things like a colostomy, recurrence,  pneumonia, heart attack, stroke, death) benefits and alternatives to surgery were discussed at length. I noted a good probability that the procedure would help improve their symptoms. The patient's questions were answered to he and his wife's satisfaction, they voiced understanding and elected to proceed with surgery. Additionally, we discussed typical postoperative expectations and the recovery process.   Lonni Pizza, MD [facility], A [facility]

## 2023-06-24 NOTE — Anesthesia Procedure Notes (Signed)
 Procedure Name: Intubation Date/Time: 06/24/2023 1:34 PM  Performed by: Dusti Tetro D, CRNAPre-anesthesia Checklist: Patient identified, Emergency Drugs available, Suction available and Patient being monitored Patient Re-evaluated:Patient Re-evaluated prior to induction Oxygen Delivery Method: Circle system utilized Preoxygenation: Pre-oxygenation with 100% oxygen Induction Type: IV induction Ventilation: Mask ventilation without difficulty Laryngoscope Size: Mac and 4 Tube type: Oral Tube size: 7.5 mm Number of attempts: 1 Airway Equipment and Method: Stylet and Oral airway Placement Confirmation: ETT inserted through vocal cords under direct vision, positive ETCO2 and breath sounds checked- equal and bilateral Secured at: 22 cm Tube secured with: Tape Dental Injury: Teeth and Oropharynx as per pre-operative assessment

## 2023-06-24 NOTE — Progress Notes (Signed)
 Min amt of serosang drainage noted on dsg pad at rectum, dsg changed, no drainage noted on mesh briefs. Provided additional pads for home use

## 2023-06-24 NOTE — Progress Notes (Signed)
 Ambulated to phase II PACU with minimal assistance, gait steady

## 2023-06-24 NOTE — Op Note (Signed)
 06/24/2023  2:14 PM  PATIENT:  Terry [name]  68 y.o. male  Patient Care Team: Wendee Lynwood HERO, NP as PCP - General (Nurse Practitioner)  PRE-OPERATIVE DIAGNOSIS:  Distal rectal polypoid mass  POST-OPERATIVE DIAGNOSIS:  Same  PROCEDURE:   Transanal excision of distal rectal polypoid mass Anorectal exam under anesthesia  SURGEON:  Surgeon(s): Teresa Lonni HERO, MD  ASSISTANT: OR Staff   ANESTHESIA:   local and general  SPECIMEN: Right posterior distal rectal polypoid lesion/mass, approximately the size of a small grape.  This is pedunculated.  It is proximal to the dentate line and clearly within the anatomic distal rectum.  Transanal excision was carried out uneventfully.  DISPOSITION OF SPECIMEN:  PATHOLOGY  COUNTS:  Sponge, needle, and instrument counts were reported correct x2 at conclusion.  EBL: 5 mL  Drains: None  PLAN OF CARE: Discharge to home after PACU  PATIENT DISPOSITION:  PACU - hemodynamically stable.  OR FINDINGS: Normal perianal skin.  Polypoid lesion which is friable is located in the right posterior position and pedunculated.  This was amenable to a transanal excision.  Polypoid lesion is approximately size of a small grape, about 2 x 2 cm in size.  It was pedunculated but the stalk did retract into the specimen after excision.  DESCRIPTION: The patient was identified in the preoperative holding area and taken to the OR. SCDs were applied.  He then underwent general endotracheal anesthesia without difficulty. The patient was then rolled onto the OR table in the prone jackknife position. Pressure points were then evaluated and padded. Benzoin was applied to the buttocks and they were gently taped apart.  The was then prepped and draped in usual sterile fashion.  A surgical timeout was performed indicating the correct patient, procedure, and positioning.  A perianal block was then created using a dilute mixture of 0.25% Marcaine  with epinephrine  and  Exparel .  After ascertaining an appropriate level of anesthesia had been achieved, a well lubricated digital rectal exam was performed. This demonstrated no clearly palpable abnormalities but there is a mobile polypoid lesion in the right posterior position.  A Hill-Ferguson anoscope was into the anal canal and circumferential inspection demonstrated healthy appearing anoderm.  Above the dentate line in the distal rectum the right posterior position there is an obvious about 2 x 2 cm polypoid lesion which is friable.  This is consistent with endoscopic findings noted previously.  This does prolapse down into the anal canal but an anoscope had remained in place order for us  to carry out the excision.   The lesion is able to be elevated with a DeBakey forcep.  It is on a stalk.  The mucosa on the stalk itself actually looks normal.  We are then able to ligate and divide this using a hand-held LigaSure device for assistance with hemostasis.  The lesion is able to be fully excised.  Of note, the stalk does retract into the specimen after excision.  The lesion is inspected.  It is soft and it has been mobile.  It does not appear at all like a classic cancer and appears more like a rectal polyp.  The rectum is irrigated.  Hemostasis is verified.  The area of excision was closed using a running 2-0 Vicryl suture.  The rectum is reirrigated.  Hemostasis is verified.  All sponge, needle, and instrument counts were reported correct.  Additional local anesthetic was infiltrated at the excision site.  The buttocks are untaped and a dressing consisting of  4 x 4's, ABD, mesh underwear was placed. He was rolled back onto a stretcher, awakened from anesthesia, extubated, and transported to recovery in satisfactory condition.  DISPOSITION: PACU in satisfactory condition.

## 2023-06-24 NOTE — Anesthesia Preprocedure Evaluation (Addendum)
 Anesthesia Evaluation  Patient identified by MRN, date of birth, ID band Patient awake    Reviewed: Allergy & Precautions, NPO status , Patient's Chart, lab work & pertinent test results  Airway Mallampati: III  TM Distance: >3 FB Neck ROM: Full    Dental  (+) Teeth Intact, Dental Advisory Given   Pulmonary Current Smoker and Patient abstained from smoking.    + decreased breath sounds      Cardiovascular hypertension,  Rhythm:Regular Rate:Normal     Neuro/Psych  PSYCHIATRIC DISORDERS Anxiety Depression       GI/Hepatic negative GI ROS, Neg liver ROS,,,  Endo/Other  negative endocrine ROS    Renal/GU negative Renal ROS     Musculoskeletal negative musculoskeletal ROS (+)    Abdominal   Peds  Hematology negative hematology ROS (+)   Anesthesia Other Findings   Reproductive/Obstetrics                             Anesthesia Physical Anesthesia Plan  ASA: 2  Anesthesia Plan: General   Post-op Pain Management: Tylenol  PO (pre-op)* and Toradol  IV (intra-op)*   Induction: Intravenous  PONV Risk Score and Plan: 2 and Ondansetron , Dexamethasone  and Midazolam   Airway Management Planned: Oral ETT  Additional Equipment: None  Intra-op Plan:   Post-operative Plan: Extubation in OR  Informed Consent: I have reviewed the patients History and Physical, chart, labs and discussed the procedure including the risks, benefits and alternatives for the proposed anesthesia with the patient or authorized representative who has indicated his/her understanding and acceptance.     Dental advisory given  Plan Discussed with: CRNA  Anesthesia Plan Comments:         Anesthesia Quick Evaluation

## 2023-06-24 NOTE — Transfer of Care (Signed)
 Immediate Anesthesia Transfer of Care Note  Patient: Terry [name]  Procedure(s) Performed: TRANSANAL EXCISION OF DISTAL RECTAL POLYPOID MASS  Patient Location: PACU  Anesthesia Type:General  Level of Consciousness: awake, alert , and oriented  Airway & Oxygen Therapy: Patient Spontanous Breathing and Patient connected to nasal cannula oxygen  Post-op Assessment: Report given to RN and Post -op Vital signs reviewed and stable  Post vital signs: Reviewed and stable  Last Vitals:  Vitals Value Taken Time  BP 129/81 06/24/23 1424  Temp 36.7 C 06/24/23 1424  Pulse 76 06/24/23 1427  Resp 17 06/24/23 1427  SpO2 98 % 06/24/23 1427  Vitals shown include unfiled device data.  Last Pain:  Vitals:   06/24/23 1156  TempSrc: Oral  PainSc: 0-No pain      Patients Stated Pain Goal: 3 (06/24/23 1156)  Complications: No notable events documented.

## 2023-06-24 NOTE — Discharge Instructions (Addendum)
 ANORECTAL SURGERY: POST OP INSTRUCTIONS  DIET: Follow a light bland diet the first 24 hours after arrival home, such as soup, liquids, crackers, etc.  Be sure to include lots of fluids daily.  Avoid fast food or heavy meals as your are more likely to get nauseated.  Eat a low fat diet the next few days after surgery.   Some bleeding with bowel movements is expected for the first couple of days but this should stop in between bowel movements  Take your usually prescribed home medications unless otherwise directed. No foreign bodies per rectum for the next 3 months (enemas, etc)  PAIN CONTROL: It is helpful to take an over-the-counter pain medication regularly for the first few days/weeks.  Choose from the following that works best for you: Ibuprofen (Advil, etc) Three 200mg  tabs every 6 hours as needed. Acetaminophen  (Tylenol , etc) 500-650mg  every 6 hours as needed NOTE: You may take both of these medications together - most patients find it most helpful when alternating between the two (i.e. Ibuprofen at 6am, tylenol  at 9am, ibuprofen at 12pm ..SABRA) A  prescription for pain medication may have been prescribed for you at discharge.  Take your pain medication as prescribed.  If you are having problems/concerns with the prescription medicine, please call us  for further advice.  Avoid getting constipated.  Between the surgery and the pain medications, it is common to experience some constipation.  Increasing fluid intake (64oz of water per day) and taking a fiber supplement (such as Metamucil, Citrucel, FiberCon) 1-2 times a day regularly will usually help prevent this problem from occurring.  Take Miralax (over the counter) 1-2x/day while taking a narcotic pain medication. If no bowel movement after 48hours, you may additionally take a laxative like a bottle of Milk of Magnesia which can be purchased over the counter. Avoid enemas.   Watch out for diarrhea.  If you have many loose bowel movements,  simplify your diet to bland foods.  Stop any stool softeners and decrease your fiber supplement. If this worsens or does not improve, please call us .  Wash / shower every day.  If you were discharged with a dressing, you may remove this the day after your surgery. You may shower normally, getting soap/water on your wound, particularly after bowel movements.  Soaking in a warm bath filled a couple inches (Sitz bath) is a great way to clean the area after a bowel movement and many patients find it is a way to soothe the area.  ACTIVITIES as tolerated:   You may resume regular (light) daily activities beginning the next day--such as daily self-care, walking, climbing stairs--gradually increasing activities as tolerated.  If you can walk 30 minutes without difficulty, it is safe to try more intense activity such as jogging, treadmill, bicycling, low-impact aerobics, etc. Refrain from any heavy lifting or straining for the first 2 weeks after your procedure, particularly if your surgery was for hemorrhoids. Avoid activities that make your pain worse You may drive when you are no longer taking prescription pain medication, you can comfortably wear a seatbelt, and you can safely maneuver your car and apply brakes.  FOLLOW UP in our office Please call [facility] at [phone] to set up an appointment to see your surgeon in the office for a follow-up appointment approximately 2 weeks after your surgery. Make sure that you call for this appointment the day you arrive home to insure a convenient appointment time.  9. If you have disability or family leave forms  that need to be completed, you may have them completed by your primary care physician's office; for return to work instructions, please ask our office staff and they will be happy to assist you in obtaining this documentation   When to call us  [phone]: Poor pain control Reactions / problems with new medications (rash/itching, etc)  Fever over  101.5 F (38.5 C) Inability to urinate Nausea/vomiting Worsening swelling or bruising Continued bleeding from incision. Increased pain, redness, or drainage from the incision  The clinic staff is available to answer your questions during regular business hours (8:30am-5pm).  Please don't hesitate to call and ask to speak to one of our nurses for clinical concerns.   A surgeon from [facility] is always on call at the hospitals   If you have a medical emergency, go to the nearest emergency room or call 911.   [facility] [address], [address], [place], [place]  [address] MAIN: [phone] FAX: [phone] [email or web address]   Post Anesthesia Home Care Instructions  Activity: Get plenty of rest for the remainder of the day. A responsible individual must stay with you for 24 hours following the procedure.  For the next 24 hours, DO NOT: -Drive a car -Advertising copywriter -Drink alcoholic beverages -Take any medication unless instructed by your physician -Make any legal decisions or sign important papers.  Meals: Start with liquid foods such as gelatin or soup. Progress to regular foods as tolerated. Avoid greasy, spicy, heavy foods. If nausea and/or vomiting occur, drink only clear liquids until the nausea and/or vomiting subsides. Call your physician if vomiting continues.  Special Instructions/Symptoms: Your throat may feel dry or sore from the anesthesia or the breathing tube placed in your throat during surgery. If this causes discomfort, gargle with warm salt water. The discomfort should disappear within 24 hours.  Information for Discharge Teaching: EXPAREL  (bupivacaine  liposome injectable suspension)   Pain relief is important to your recovery. The goal is to control your pain so you can move easier and return to your normal activities as soon as possible after your procedure. Your physician may use  several types of medicines to manage pain, swelling, and more.  Your surgeon or anesthesiologist gave you EXPAREL (bupivacaine ) to help control your pain after surgery.  EXPAREL  is a local anesthetic designed to release slowly over an extended period of time to provide pain relief by numbing the tissue around the surgical site. EXPAREL  is designed to release pain medication over time and can control pain for up to 72 hours. Depending on how you respond to EXPAREL , you may require less pain medication during your recovery. EXPAREL  can help reduce or eliminate the need for opioids during the first few days after surgery when pain relief is needed the most. EXPAREL  is not an opioid and is not addictive. It does not cause sleepiness or sedation.   Important! A teal colored band has been placed on your arm with the date, time and amount of EXPAREL  you have received. Please leave this armband in place for the full 96 hours following administration, and then you may remove the band. If you return to the hospital for any reason within 96 hours following the administration of EXPAREL , the armband provides important information that your health care providers to know, and alerts them that you have received this anesthetic.    Possible side effects of EXPAREL : Temporary loss of sensation or ability to move in the area where medication  was injected. Nausea, vomiting, constipation Rarely, numbness and tingling in your mouth or lips, lightheadedness, or anxiety may occur. Call your doctor right away if you think you may be experiencing any of these sensations, or if you have other questions regarding possible side effects.  Follow all other discharge instructions given to you by your surgeon or nurse. Eat a healthy diet and drink plenty of water or other fluids.  Do not remove green armband before Tuesday, June 28, 2023. May take Tylenol  beginning at 7:30 Pm as needed for soreness.

## 2023-06-27 ENCOUNTER — Encounter (HOSPITAL_BASED_OUTPATIENT_CLINIC_OR_DEPARTMENT_OTHER): Payer: Self-pay | Admitting: Surgery

## 2023-06-27 NOTE — Anesthesia Postprocedure Evaluation (Signed)
 Anesthesia Post Note  Patient: Terry [name]  Procedure(s) Performed: TRANSANAL EXCISION OF DISTAL RECTAL POLYPOID MASS     Patient location during evaluation: PACU Anesthesia Type: General Level of consciousness: awake and alert Pain management: pain level controlled Vital Signs Assessment: post-procedure vital signs reviewed and stable Respiratory status: spontaneous breathing, nonlabored ventilation, respiratory function stable and patient connected to nasal cannula oxygen Cardiovascular status: blood pressure returned to baseline and stable Postop Assessment: no apparent nausea or vomiting Anesthetic complications: no   No notable events documented.  Last Vitals:  Vitals:   06/24/23 1500 06/24/23 1551  BP: 122/85 132/81  Pulse: 61 66  Resp: 13 18  Temp: (!) 36.3 C (!) 36.4 C  SpO2: 93% 99%    Last Pain:  Vitals:   06/24/23 1551  TempSrc: Oral  PainSc: 0-No pain                 Franky JONETTA Bald

## 2023-06-28 LAB — SURGICAL PATHOLOGY

## 2023-08-02 NOTE — Progress Notes (Signed)
I have called him 5 separate times now in an effort to reach him to review pathology. Every time we are reaching generic voice mail. I have recommended follow-up in the office for postop evaluation and to also review his procedure and pathology. My nurse, Tama Gander, was able to reach him on the phone and he indicated to her that he wanted a phone call and would not schedule a follow-up visit.   Chemira, please send certified letter with copy of pathology report and our recommendation for repeat colonoscopy in the next year with Dr. Tobi Bastos whom he sees in the office 08/15/23.  Angelena Form

## 2023-08-06 ENCOUNTER — Other Ambulatory Visit: Payer: Self-pay | Admitting: Nurse Practitioner

## 2023-08-06 DIAGNOSIS — I10 Essential (primary) hypertension: Secondary | ICD-10-CM

## 2023-08-06 DIAGNOSIS — R351 Nocturia: Secondary | ICD-10-CM

## 2023-08-06 DIAGNOSIS — R3912 Poor urinary stream: Secondary | ICD-10-CM

## 2023-08-08 NOTE — Telephone Encounter (Signed)
 Call pt and schedule a appt for BP check

## 2023-08-08 NOTE — Telephone Encounter (Signed)
 Need appt in the next 30 days for BP recheck

## 2023-08-15 ENCOUNTER — Ambulatory Visit: Payer: Medicare (Managed Care) | Admitting: Gastroenterology

## 2023-08-15 ENCOUNTER — Encounter: Payer: Self-pay | Admitting: Gastroenterology

## 2023-08-15 VITALS — BP 175/90 | HR 87 | Temp 98.1°F | Wt 239.8 lb

## 2023-08-15 DIAGNOSIS — Z09 Encounter for follow-up examination after completed treatment for conditions other than malignant neoplasm: Secondary | ICD-10-CM | POA: Diagnosis not present

## 2023-08-15 DIAGNOSIS — Z860101 Personal history of adenomatous and serrated colon polyps: Secondary | ICD-10-CM | POA: Diagnosis not present

## 2023-08-15 DIAGNOSIS — Z860100 Personal history of colon polyps, unspecified: Secondary | ICD-10-CM

## 2023-08-15 NOTE — Progress Notes (Signed)
 Wyline Mood MD, MRCP(U.K) [address]  [address]  [place], [place] [address]  Main: [phone]  Fax: [phone]   Gastroenterology Consultation  Referring Provider:     Eden Emms, NP Primary Care Physician:  Eden Emms, NP Primary Gastroenterologist:  Dr. Wyline Mood  Reason for Consultation:     Colonoscopy follow-up        HPI:   Terry [name] is a 68 y.o. y/o male referred for consultation & management  by  Eden Emms, NP.    I performed a colonoscopy in October 2024 for a positive Cologuard a 7 mm polyp in the descending colon was resected at 10 mm polyp in the descending colon was resected.  A 25 mm polyp in the ascending colon semipedunculated was resected with submucosal injection.  There is a large 30 mm polyp found in the distal rectum that was semipedunculated it was seen on area of an hemorrhoid and I did not feel safe to resect and refer to Dr. 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  All polyps were tubular adenomas or tubulovillous adenomas. Multiple attempts were made by Dr. Cliffton Asters to contact him to schedule a follow-up colonoscopy but did not have much success.     Past Medical History:  Diagnosis Date   Anxiety    Chronic hand pain, right    Depression    Dyspnea    Hypertension    Pre-diabetes    Pulmonary nodules 07/01/2013   Umbilical hernia without obstruction and without gangrene     Past Surgical History:  Procedure Laterality Date   COLONOSCOPY WITH PROPOFOL N/A 04/07/2023   Procedure: COLONOSCOPY WITH PROPOFOL;  Surgeon: Wyline Mood, MD;  Location: [facility];  Service: Gastroenterology;  Laterality: N/A;   hernia repair  12/23/2022   NO PAST SURGERIES     TRANSANAL EXCISION OF RECTAL MASS N/A 06/24/2023   Procedure: TRANSANAL EXCISION OF DISTAL RECTAL POLYPOID MASS;  Surgeon: Andria Meuse, MD;  Location: [facility];  Service: General;  Laterality: N/A;    Prior to Admission medications   Medication Sig Start Date End Date Taking? Authorizing Provider  amLODipine (NORVASC) 5 MG tablet TAKE 1 TABLET (5 MG TOTAL) BY MOUTH DAILY. 08/08/23   Eden Emms, NP    Family History  Problem Relation Age of Onset   Diabetes Mother    Arthritis Mother    Cancer Maternal Grandmother        unsure, ?lung (smoker)     Social History   Tobacco Use   Smoking status: Every Day    Current packs/day: 1.50    Average packs/day: 1.5 packs/day for 50.2 years (75.3 ttl pk-yrs)    Types: Cigarettes    Start date: 06/14/1973   Smokeless tobacco: Never  Vaping Use   Vaping status: Never Used  Substance Use Topics   Alcohol use: Yes    Alcohol/week: 2.0 standard drinks of alcohol    Types: 2 Cans of beer per week    Comment: occasional,none last 24hrs   Drug use: Not Currently    Types: Marijuana    Comment: daily    Allergies as of 08/15/2023   (No Known Allergies)    Review of Systems:    All systems reviewed and negative except where noted in HPI.   Physical Exam:  BP (!) 175/90   Pulse 87  Temp 98.1 F (36.7 C) (Oral)   Wt 239 lb 12.8 oz (108.8 kg)   BMI 32.52 kg/m  No LMP for male patient. Psych:  Alert and cooperative. Normal mood and affect. General:   Alert,  Well-developed, well-nourished, pleasant and cooperative in NAD Head:  Normocephalic and atraumatic. Eyes:  Sclera clear, no icterus.   Conjunctiva pink. Neurologic:  Alert and oriented x3;  grossly normal neurologically. Psych:  Alert and cooperative. Normal mood and affect.  Imaging Studies: No results found.  Assessment and Plan:   Terry [name] is a 67 y.o. y/o male here to follow-up after a colonoscopy performed for a positive Cologuard and found to have a few polyps which were resected in addition a large polyp was found at the anorectal region that was too large for me to resect.  Status post transanal  resection by Dr. Marin Olp.  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  Plan 1.  Repeat colonoscopy 1 year after initial to look for any recurrence of polyp for surveillance.  Follow up as needed  Dr Wyline Mood MD,MRCP(U.K)

## 2023-09-06 ENCOUNTER — Ambulatory Visit (INDEPENDENT_AMBULATORY_CARE_PROVIDER_SITE_OTHER): Payer: Medicare (Managed Care) | Admitting: Nurse Practitioner

## 2023-09-06 VITALS — BP 142/88 | HR 61 | Temp 97.8°F | Ht 72.0 in | Wt 236.8 lb

## 2023-09-06 DIAGNOSIS — I10 Essential (primary) hypertension: Secondary | ICD-10-CM | POA: Diagnosis not present

## 2023-09-06 DIAGNOSIS — Z720 Tobacco use: Secondary | ICD-10-CM

## 2023-09-06 MED ORDER — AMLODIPINE BESYLATE 10 MG PO TABS
10.0000 mg | ORAL_TABLET | Freq: Every day | ORAL | 1 refills | Status: DC
Start: 2023-09-06 — End: 2024-03-15

## 2023-09-06 NOTE — Progress Notes (Signed)
   Established Patient Office Visit  Subjective   Patient ID: Terry [name], male    DOB: 1956/03/25  Age: 68 y.o. MRN: [id number]  Chief Complaint  Patient presents with   Hypertension    Follow up on BP recheck. Pt BP reading this morning: 142/82 @645am .  Pt complains of feeling okay.       HTN: patient has a history of the same and was started on amlodipine 5mg  on 05/10/2023 and requested to follow up in 6 weeks. He is here today for that follow up. He is check ing the blood pressure at home and got a reading of 142/82 this morning. States that he takes it most. He does check his blood  States that he is doing less than a pack a day. He is trying to cut down   State that in the sumer he was exercising 25-78min a day but has not been as of late. But is planning to start back and is working towards that     Review of Systems  Constitutional:  Negative for chills and fever.  Respiratory:  Negative for shortness of breath.   Cardiovascular:  Negative for chest pain.  Neurological:  Negative for headaches.  Psychiatric/Behavioral:  Negative for hallucinations and suicidal ideas.       Objective:     BP (!) 142/88   Pulse 61   Temp 97.8 F (36.6 C) (Oral)   Ht 6' (1.829 m)   Wt 236 lb 12.8 oz (107.4 kg)   SpO2 94%   BMI 32.12 kg/m    Physical Exam Vitals and nursing note reviewed.  Constitutional:      Appearance: Normal appearance.  Cardiovascular:     Rate and Rhythm: Normal rate and regular rhythm.     Heart sounds: Normal heart sounds.  Pulmonary:     Effort: Pulmonary effort is normal.     Breath sounds: Normal breath sounds.  Abdominal:     General: Bowel sounds are normal.  Neurological:     Mental Status: He is alert.      No results found for any visits on 09/06/23.    The 10-year ASCVD risk score (Arnett DK, et al., 2019) is: 28.5%    Assessment & Plan:   Problem List Items Addressed This Visit       Cardiovascular and Mediastinum    Primary hypertension - Primary   Patient was maintained on amlodipine 5 mg daily.  Tolerating medication well.  Blood pressure did come down but still not within goal.  Will increase amlodipine to 10 mg daily.  Patient to continue taking his blood pressure readings at home.  Continue work on healthy lifestyle modifications inclusive of diet and exercise encouraged smoking cessation also      Relevant Medications   amLODipine (NORVASC) 10 MG tablet     Other   Tobacco abuse   Did discuss smoking cessation in regards to Chantix.  Patient will think on the medication and decide whether he wants to pursue or not.  Patient states he has cut down the amount he is smoking       Return in about 3 months (around 12/07/2023) for BP recheck.    Audria Nine, NP

## 2023-09-06 NOTE — Assessment & Plan Note (Signed)
 Patient was maintained on amlodipine 5 mg daily.  Tolerating medication well.  Blood pressure did come down but still not within goal.  Will increase amlodipine to 10 mg daily.  Patient to continue taking his blood pressure readings at home.  Continue work on healthy lifestyle modifications inclusive of diet and exercise encouraged smoking cessation also

## 2023-09-06 NOTE — Patient Instructions (Signed)
 Nice to see you today I am going to increase the amlodipine to 10mg  daily. If you have 5mg  tablets at home you can take 2 to equal 10mg  daily. Follow up with me in 3 months, sooner if you need me Continue working on exercise  I want the top number to be under 140 and the bottom number to be under 90 most of the time

## 2023-09-06 NOTE — Assessment & Plan Note (Signed)
 Did discuss smoking cessation in regards to Chantix.  Patient will think on the medication and decide whether he wants to pursue or not.  Patient states he has cut down the amount he is smoking

## 2023-11-08 ENCOUNTER — Ambulatory Visit: Payer: Medicare (Managed Care) | Admitting: Nurse Practitioner

## 2023-12-07 ENCOUNTER — Ambulatory Visit (INDEPENDENT_AMBULATORY_CARE_PROVIDER_SITE_OTHER): Payer: Medicare (Managed Care) | Admitting: Nurse Practitioner

## 2023-12-07 VITALS — BP 130/80 | HR 73 | Temp 97.6°F | Ht 72.0 in | Wt 243.0 lb

## 2023-12-07 DIAGNOSIS — I10 Essential (primary) hypertension: Secondary | ICD-10-CM | POA: Diagnosis not present

## 2023-12-07 DIAGNOSIS — R220 Localized swelling, mass and lump, head: Secondary | ICD-10-CM | POA: Diagnosis not present

## 2023-12-07 NOTE — Assessment & Plan Note (Signed)
 Query salivary gland stone.  Patient will try doing sour candy on his present to see if this does not offer help.  If no help we can always treat for a sialadenitis.  And escalate up to CT scan if needed.  Patient is asymptomatic in office no swelling or mass palpated no tenderness per patient report

## 2023-12-07 NOTE — Patient Instructions (Signed)
 Nice to see you today  Try getting sour candy and see if that helps If not let me know  We will plan to see each other again in 3 months for your physical and labs

## 2023-12-07 NOTE — Progress Notes (Signed)
 Established Patient Office Visit  Subjective   Patient ID: Terry [name], male    DOB: 1955/11/20  Age: 68 y.o. MRN: [id number]  Chief Complaint  Patient presents with   blood pressure recheck    BP home readings from yesterday: 132/80. Pt complains of swelling around the ankles due to amlodipine .     HPI  HTN: Patient last seen by me on 09/06/2023 patient was maintained on amlodipine  5 mg but was not at goal so we did increase amlodipine  to 10 mg.  Patient does check blood pressure at home.  He is here for follow-up today. He is checking it a couple times a week at home he is getting all readings within normal.  Most recent reading was 132/80 that was yesterday.  Does endorse some dependent edema more so around his ankles.  States that will resolve with elevation milligrams to bed at night.  Not bothersome at this juncture  Jaw swelling: states that for the past few weeks he is having swelling to the left jaw. States that he feels pressure. No red or hot touch. No trounle with the teeth no ear pain just pressure. States that the ear will itch.     Review of Systems  Constitutional:  Negative for chills and fever.  Respiratory:  Positive for shortness of breath (baseline with smoking).   Cardiovascular:  Negative for chest pain.  Neurological:  Negative for headaches.  Psychiatric/Behavioral:  Negative for hallucinations and suicidal ideas.       Objective:     BP 130/80   Pulse 73   Temp 97.6 F (36.4 C) (Oral)   Ht 6' (1.829 m)   Wt 243 lb (110.2 kg)   SpO2 95%   BMI 32.96 kg/m    Physical Exam Vitals and nursing note reviewed.  Constitutional:      Appearance: Normal appearance.  HENT:     Head:      Right Ear: Tympanic membrane, ear canal and external ear normal.     Left Ear: Tympanic membrane, ear canal and external ear normal.     Mouth/Throat:     Mouth: Mucous membranes are moist.     Pharynx: Oropharynx is clear.   Cardiovascular:     Rate and  Rhythm: Normal rate and regular rhythm.     Heart sounds: Normal heart sounds.  Pulmonary:     Effort: Pulmonary effort is normal.     Breath sounds: Normal breath sounds.   Musculoskeletal:     Right lower leg: Edema (trace) present.     Left lower leg: Edema (trace) present.  Lymphadenopathy:     Cervical: No cervical adenopathy.   Neurological:     Mental Status: He is alert.      No results found for any visits on 12/07/23.    The 10-year ASCVD risk score (Arnett DK, et al., 2019) is: 25.8%    Assessment & Plan:   Problem List Items Addressed This Visit       Cardiovascular and Mediastinum   Primary hypertension   Patient currently maintained on amlodipine  10 mg daily.  Some slight lower extremity edema but tolerable.  Will continue amlodipine  10 mg daily.        Other   Swelling of left side of face - Primary   Query salivary gland stone.  Patient will try doing sour candy on his present to see if this does not offer help.  If no help we can always treat  for a sialadenitis.  And escalate up to CT scan if needed.  Patient is asymptomatic in office no swelling or mass palpated no tenderness per patient report       Return in about 3 months (around 03/08/2024) for CPE and Labs.    Adina Crandall, NP

## 2023-12-07 NOTE — Assessment & Plan Note (Signed)
 Patient currently maintained on amlodipine  10 mg daily.  Some slight lower extremity edema but tolerable.  Will continue amlodipine  10 mg daily.

## 2024-03-14 ENCOUNTER — Other Ambulatory Visit: Payer: Self-pay | Admitting: Nurse Practitioner

## 2024-03-14 DIAGNOSIS — I10 Essential (primary) hypertension: Secondary | ICD-10-CM

## 2024-05-02 ENCOUNTER — Encounter: Payer: Self-pay | Admitting: Nurse Practitioner

## 2024-05-02 ENCOUNTER — Ambulatory Visit (INDEPENDENT_AMBULATORY_CARE_PROVIDER_SITE_OTHER): Payer: Medicare (Managed Care) | Admitting: Nurse Practitioner

## 2024-05-02 ENCOUNTER — Other Ambulatory Visit: Payer: Medicare (Managed Care)

## 2024-05-02 VITALS — BP 132/82 | HR 80 | Temp 97.6°F | Ht 71.5 in | Wt 233.8 lb

## 2024-05-02 DIAGNOSIS — I10 Essential (primary) hypertension: Secondary | ICD-10-CM

## 2024-05-02 DIAGNOSIS — E669 Obesity, unspecified: Secondary | ICD-10-CM

## 2024-05-02 DIAGNOSIS — E7800 Pure hypercholesterolemia, unspecified: Secondary | ICD-10-CM

## 2024-05-02 DIAGNOSIS — M7542 Impingement syndrome of left shoulder: Secondary | ICD-10-CM | POA: Diagnosis not present

## 2024-05-02 DIAGNOSIS — Z125 Encounter for screening for malignant neoplasm of prostate: Secondary | ICD-10-CM | POA: Diagnosis not present

## 2024-05-02 DIAGNOSIS — Z1159 Encounter for screening for other viral diseases: Secondary | ICD-10-CM | POA: Diagnosis not present

## 2024-05-02 DIAGNOSIS — Z1211 Encounter for screening for malignant neoplasm of colon: Secondary | ICD-10-CM

## 2024-05-02 DIAGNOSIS — Z6832 Body mass index (BMI) 32.0-32.9, adult: Secondary | ICD-10-CM

## 2024-05-02 DIAGNOSIS — R351 Nocturia: Secondary | ICD-10-CM

## 2024-05-02 DIAGNOSIS — Z720 Tobacco use: Secondary | ICD-10-CM

## 2024-05-02 DIAGNOSIS — Z0000 Encounter for general adult medical examination without abnormal findings: Secondary | ICD-10-CM

## 2024-05-02 DIAGNOSIS — Z131 Encounter for screening for diabetes mellitus: Secondary | ICD-10-CM | POA: Diagnosis not present

## 2024-05-02 DIAGNOSIS — Z122 Encounter for screening for malignant neoplasm of respiratory organs: Secondary | ICD-10-CM

## 2024-05-02 DIAGNOSIS — R918 Other nonspecific abnormal finding of lung field: Secondary | ICD-10-CM

## 2024-05-02 LAB — LIPID PANEL
Cholesterol: 142 mg/dL (ref 0–200)
HDL: 28.1 mg/dL — ABNORMAL LOW (ref >39.00)
LDL Cholesterol: 85 mg/dL (ref 0–99)
NonHDL: 113.76
Total CHOL/HDL Ratio: 5
Triglycerides: 145 mg/dL (ref 0.0–149.0)
VLDL: 29 mg/dL (ref 0.0–40.0)

## 2024-05-02 LAB — COMPREHENSIVE METABOLIC PANEL WITH GFR
ALT: 17 U/L (ref 0–53)
AST: 13 U/L (ref 0–37)
Albumin: 4.3 g/dL (ref 3.5–5.2)
Alkaline Phosphatase: 46 U/L (ref 39–117)
BUN: 13 mg/dL (ref 6–23)
CO2: 29 meq/L (ref 19–32)
Calcium: 9 mg/dL (ref 8.4–10.5)
Chloride: 102 meq/L (ref 96–112)
Creatinine, Ser: 0.99 mg/dL (ref 0.40–1.50)
GFR: 79.41 mL/min (ref >60.00)
Glucose, Bld: 102 mg/dL — ABNORMAL HIGH (ref 70–99)
Potassium: 4.6 meq/L (ref 3.5–5.1)
Sodium: 138 meq/L (ref 135–145)
Total Bilirubin: 0.5 mg/dL (ref 0.2–1.2)
Total Protein: 7 g/dL (ref 6.0–8.3)

## 2024-05-02 LAB — TSH: TSH: 3.93 u[IU]/mL (ref 0.35–5.50)

## 2024-05-02 LAB — PSA: PSA: 0.53 ng/mL (ref 0.10–4.00)

## 2024-05-02 MED ORDER — MELOXICAM 15 MG PO TABS: 15.0000 mg | ORAL_TABLET | Freq: Every day | ORAL | 0 refills | Status: AC

## 2024-05-02 NOTE — Patient Instructions (Signed)
 Nice to see you today I will be in touch with the labs once I have them  Follow up with me in 1 year, sooner if you need me  Take the meloxicam  daily with food for the next 7 days and then you can take it as needed thereafter

## 2024-05-02 NOTE — Assessment & Plan Note (Signed)
 Meloxicam  15 mg daily for the next week with food.  Can use it as needed thereafter.  Exercises given at discharge

## 2024-05-02 NOTE — Assessment & Plan Note (Signed)
Pending lipid panel today 

## 2024-05-02 NOTE — Assessment & Plan Note (Signed)
 History of the same has trialed tamsulosin  without good benefit.  Will refer to urologist at patient's request

## 2024-05-02 NOTE — Progress Notes (Signed)
 Established Patient Office Visit  Subjective   Patient ID: Terry [name], male    DOB: 05/24/1956  Age: 68 y.o. MRN: [id number]  Chief Complaint  Patient presents with   Annual Exam    HPI  HTN: Patient currently maintained on amlodipine  10 mg daily. He does not check blood pressure at home   for complete physical and follow up of chronic conditions.  Immunizations: -Tetanus: Completed in within 10 years  -Influenza: refused  -Shingles:get at local pharmacy  -Pneumonia: Completed 2024  Diet: Fair diet. 3 meals a day and snacks. Coffee water soda and tea Exercise: No regular exercise. States very little. Yard work and walking   Eye exam: Completes annually. Due glasses  Dental exam: PRN    Colonoscopy: Completed in 04/07/2023, repeat date 1 year? Lung Cancer Screening: Deferred in the past  PSA: Due  Sleep: going to bed 10-11 and states that he gets up around 6-7. Does feel rested   Left shoulder pain: Patient has been dealing with this for over a week.  States that he did help a family member with a pull behind leaf gather and felt a pop in her left shoulder.  Patient states that he has a history of bursitis and it feels similar.  He has used meloxicam  in the past with benefit.       Review of Systems  Constitutional:  Negative for chills and fever.  Respiratory:  Positive for shortness of breath (DOE).   Cardiovascular:  Negative for chest pain and leg swelling.  Gastrointestinal:  Negative for abdominal pain, blood in stool, constipation, diarrhea, nausea and vomiting.       Bm daily   Genitourinary:  Negative for dysuria and hematuria.  Musculoskeletal:  Positive for joint pain.  Neurological:  Negative for dizziness, tingling and headaches.  Psychiatric/Behavioral:  Negative for hallucinations and suicidal ideas.       Objective:     BP 132/82   Pulse 80   Temp 97.6 F (36.4 C) (Oral)   Ht 5' 11.5 (1.816 m)   Wt 233 lb 12.8 oz (106.1 kg)    SpO2 95%   BMI 32.15 kg/m  BP Readings from Last 3 Encounters:  05/02/24 132/82  12/07/23 130/80  09/06/23 (!) 142/88   Wt Readings from Last 3 Encounters:  05/02/24 233 lb 12.8 oz (106.1 kg)  12/07/23 243 lb (110.2 kg)  09/06/23 236 lb 12.8 oz (107.4 kg)   SpO2 Readings from Last 3 Encounters:  05/02/24 95%  12/07/23 95%  09/06/23 94%      Physical Exam Vitals and nursing note reviewed.  Constitutional:      Appearance: Normal appearance.  HENT:     Right Ear: Tympanic membrane, ear canal and external ear normal.     Left Ear: Tympanic membrane, ear canal and external ear normal.     Mouth/Throat:     Mouth: Mucous membranes are moist.     Pharynx: Oropharynx is clear.  Eyes:     Extraocular Movements: Extraocular movements intact.     Pupils: Pupils are equal, round, and reactive to light.  Cardiovascular:     Rate and Rhythm: Normal rate and regular rhythm.     Pulses: Normal pulses.     Heart sounds: Normal heart sounds.  Pulmonary:     Effort: Pulmonary effort is normal.     Breath sounds: Normal breath sounds.  Abdominal:     General: Bowel sounds are normal. There is no distension.  Palpations: There is no mass.     Tenderness: There is no abdominal tenderness.     Hernia: No hernia is present.     Comments: Diastasis rectus   Musculoskeletal:        General: Tenderness present.       Arms:     Right lower leg: No edema.     Left lower leg: No edema.  Lymphadenopathy:     Cervical: No cervical adenopathy.  Skin:    General: Skin is warm.  Neurological:     General: No focal deficit present.     Mental Status: He is alert.     Deep Tendon Reflexes:     Reflex Scores:      Bicep reflexes are 2+ on the right side and 2+ on the left side.      Patellar reflexes are 2+ on the right side and 2+ on the left side.    Comments: Bilateral upper and lower extremity strength 5/5  Psychiatric:        Mood and Affect: Mood normal.        Behavior:  Behavior normal.        Thought Content: Thought content normal.        Judgment: Judgment normal.      No results found for any visits on 05/02/24.    The 10-year ASCVD risk score (Arnett DK, et al., 2019) is: 26.4%    Assessment & Plan:   Problem List Items Addressed This Visit       Cardiovascular and Mediastinum   Primary hypertension   Patient currently maintained on amlodipine  10 mg daily.  Blood pressure elevated upon initial check but within normal limits on recheck.  Continue amlodipine  10 mg daily      Relevant Orders   CBC with Differential/Platelet   Comprehensive metabolic panel with GFR   TSH   Lipid panel     Respiratory   Pulmonary nodules   History of the same.  Referral to LDCT        Musculoskeletal and Integument   Shoulder impingement syndrome, left   Meloxicam  15 mg daily for the next week with food.  Can use it as needed thereafter.  Exercises given at discharge      Relevant Medications   meloxicam  (MOBIC ) 15 MG tablet     Other   Tobacco abuse   Preventative health care - Primary   Discussed age-appropriate immunization and screening exams.  Did review patient's personal, surgical, social, family histories.  Patient is up-to-date with all age-appropriate vaccinations and like.  Can get shingles vaccine at local pharmacy.  He refused flu vaccine today.  Referral for CRC screening.  PSA for prostate cancer screening today.  Patient was given information at discharge about preventative healthcare maintenance with anticipatory guidance      Nocturia   History of the same has trialed tamsulosin  without good benefit.  Will refer to urologist at patient's request      Obesity (BMI 30-39.9)   Pending TSH, A1c, lipid panel.      Relevant Orders   Lipid panel   Elevated LDL cholesterol level   Pending lipid panel today      Relevant Orders   Lipid panel   Other Visit Diagnoses       Encounter for hepatitis C screening test for low  risk patient       Relevant Orders   Hepatitis C antibody     Screening for colon  cancer       Relevant Orders   Ambulatory referral to Gastroenterology     Screening for diabetes mellitus       Relevant Orders   Hemoglobin A1c     Screening for prostate cancer       Relevant Orders   PSA     Screening for lung cancer       Relevant Orders   Ambulatory Referral Lung Cancer Screening [facility]       Return in about 1 year (around 05/02/2025) for CPE and Labs.    Adina Crandall, NP

## 2024-05-02 NOTE — Assessment & Plan Note (Signed)
 Discussed age-appropriate immunization and screening exams.  Did review patient's personal, surgical, social, family histories.  Patient is up-to-date with all age-appropriate vaccinations and like.  Can get shingles vaccine at local pharmacy.  He refused flu vaccine today.  Referral for CRC screening.  PSA for prostate cancer screening today.  Patient was given information at discharge about preventative healthcare maintenance with anticipatory guidance

## 2024-05-02 NOTE — Assessment & Plan Note (Signed)
 Pending TSH, A1c, lipid panel.

## 2024-05-02 NOTE — Assessment & Plan Note (Signed)
 Patient currently maintained on amlodipine  10 mg daily.  Blood pressure elevated upon initial check but within normal limits on recheck.  Continue amlodipine  10 mg daily

## 2024-05-02 NOTE — Assessment & Plan Note (Signed)
 History of the same.  Referral to LDCT

## 2024-05-03 LAB — HEMOGLOBIN A1C: Hgb A1c MFr Bld: 6.4 % (ref 4.6–6.5)

## 2024-05-04 ENCOUNTER — Ambulatory Visit: Payer: Self-pay | Admitting: Nurse Practitioner

## 2024-05-04 ENCOUNTER — Telehealth: Payer: Self-pay | Admitting: Nurse Practitioner

## 2024-05-04 ENCOUNTER — Other Ambulatory Visit: Payer: Self-pay | Admitting: Nurse Practitioner

## 2024-05-04 DIAGNOSIS — R710 Precipitous drop in hematocrit: Secondary | ICD-10-CM

## 2024-05-04 DIAGNOSIS — R7989 Other specified abnormal findings of blood chemistry: Secondary | ICD-10-CM

## 2024-05-04 LAB — CBC WITH DIFFERENTIAL/PLATELET
Absolute Lymphocytes: 2836 {cells}/uL (ref 850–3900)
Absolute Monocytes: 768 {cells}/uL (ref 200–950)
Basophils Absolute: 0 {cells}/uL (ref 0–200)
Basophils Relative: 0 %
Eosinophils Absolute: 0 {cells}/uL — ABNORMAL LOW (ref 15–500)
Eosinophils Relative: 0 %
HCT: 36.2 % — ABNORMAL LOW (ref 38.5–50.0)
Hemoglobin: 12.4 g/dL — ABNORMAL LOW (ref 13.2–17.1)
MCH: 32.5 pg (ref 27.0–33.0)
MCHC: 34.3 g/dL (ref 32.0–36.0)
MCV: 94.8 fL (ref 80.0–100.0)
MPV: 9.3 fL (ref 7.5–12.5)
Monocytes Relative: 19.2 %
Neutro Abs: 396 {cells}/uL — CL (ref 1500–7800)
Neutrophils Relative %: 9.9 %
Platelets: 160 Thousand/uL (ref 140–400)
RBC: 3.82 Million/uL — ABNORMAL LOW (ref 4.20–5.80)
RDW: 15.1 % — ABNORMAL HIGH (ref 11.0–15.0)
Total Lymphocyte: 70.9 %
WBC: 4 Thousand/uL (ref 3.8–10.8)

## 2024-05-04 LAB — HEPATITIS C ANTIBODY: Hepatitis C Ab: NONREACTIVE

## 2024-05-04 LAB — EXTRA SPECIMEN

## 2024-05-04 NOTE — Telephone Encounter (Signed)
 I was called last night by our answering service with a critically low neutrophil count.  The problem is being addressed.  Labs ordered for more tests.  Thank you

## 2024-05-04 NOTE — Progress Notes (Unsigned)
 Got a call from Pathologist Bernida). State that the differential showed possible bands and abnormally shaped cells. I called and spoke to the patient. I informed him that we are going to get him to heme/onc. Urgent referral placed   Can we make referrals aware of the urgent referral

## 2024-05-04 NOTE — Progress Notes (Unsigned)
Referral team working on this

## 2024-05-04 NOTE — Telephone Encounter (Signed)
 I have already released labs via mychart with my comments

## 2024-05-04 NOTE — Telephone Encounter (Unsigned)
 Copied from CRM [id number]. Topic: General - Other >> May 04, 2024 11:57 AM Robinson H wrote: Reason for CRM: Patient states he received a message that he needed to have fecal test and was asked if he would pick up kit or wants it mailed and patient states he would like it mailed to his home address.  Norleen [phone]

## 2024-05-04 NOTE — Telephone Encounter (Signed)
-----   Message from [facility] T sent at 05/04/2024 12:27 PM EST -----  ----- Message ----- From: Albino Manuelita BROCKS, CMA Sent: 05/04/2024  12:09 PM EST To: Wendee Gunnels  ----- Message from Manuelita BROCKS Albino, CMA sent at 05/04/2024 12:09 PM EST -----

## 2024-05-04 NOTE — Telephone Encounter (Signed)
 We can let the lab know to mail it.

## 2024-05-04 NOTE — Telephone Encounter (Signed)
 Terry [name]

## 2024-05-04 NOTE — Telephone Encounter (Signed)
 Spoke with Acupuncturist at [facility]. Took a verbal order for a critical lab on pt's CBC for ANC of 396. Glendia states that the labs should be viewable in Epic shortly.

## 2024-05-08 NOTE — Progress Notes (Signed)
 Appointment scheduled with oncology

## 2024-05-16 ENCOUNTER — Encounter: Payer: Self-pay | Admitting: Oncology

## 2024-05-16 ENCOUNTER — Inpatient Hospital Stay: Payer: Medicare (Managed Care) | Attending: Oncology | Admitting: Oncology

## 2024-05-16 ENCOUNTER — Inpatient Hospital Stay: Payer: Medicare (Managed Care)

## 2024-05-16 ENCOUNTER — Telehealth: Payer: Self-pay

## 2024-05-16 VITALS — BP 122/85 | HR 90 | Temp 98.1°F | Wt 234.1 lb

## 2024-05-16 DIAGNOSIS — Z79899 Other long term (current) drug therapy: Secondary | ICD-10-CM | POA: Diagnosis not present

## 2024-05-16 DIAGNOSIS — D696 Thrombocytopenia, unspecified: Secondary | ICD-10-CM | POA: Diagnosis not present

## 2024-05-16 DIAGNOSIS — D649 Anemia, unspecified: Secondary | ICD-10-CM | POA: Insufficient documentation

## 2024-05-16 DIAGNOSIS — D709 Neutropenia, unspecified: Secondary | ICD-10-CM | POA: Diagnosis not present

## 2024-05-16 DIAGNOSIS — Z720 Tobacco use: Secondary | ICD-10-CM

## 2024-05-16 DIAGNOSIS — D72829 Elevated white blood cell count, unspecified: Secondary | ICD-10-CM | POA: Insufficient documentation

## 2024-05-16 DIAGNOSIS — C9200 Acute myeloblastic leukemia, not having achieved remission: Secondary | ICD-10-CM | POA: Insufficient documentation

## 2024-05-16 DIAGNOSIS — F17210 Nicotine dependence, cigarettes, uncomplicated: Secondary | ICD-10-CM | POA: Insufficient documentation

## 2024-05-16 LAB — CBC WITH DIFFERENTIAL/PLATELET
Basophils Absolute: 0 K/uL (ref 0.0–0.1)
Basophils Relative: 0 %
Blasts: 16 %
Eosinophils Absolute: 0 K/uL (ref 0.0–0.5)
Eosinophils Relative: 0 %
HCT: 28.4 % — ABNORMAL LOW (ref 39.0–52.0)
Hemoglobin: 10 g/dL — ABNORMAL LOW (ref 13.0–17.0)
Lymphocytes Relative: 68 %
Lymphs Abs: 3.1 K/uL (ref 0.7–4.0)
MCH: 32.7 pg (ref 26.0–34.0)
MCHC: 35.2 g/dL (ref 30.0–36.0)
MCV: 92.8 fL (ref 80.0–100.0)
Monocytes Absolute: 0.1 K/uL (ref 0.1–1.0)
Monocytes Relative: 2 %
Neutro Abs: 0.3 K/uL — CL (ref 1.7–7.7)
Neutrophils Relative %: 7 %
Other: 7 %
Platelets: 87 K/uL — ABNORMAL LOW (ref 150–400)
RBC: 3.06 MIL/uL — ABNORMAL LOW (ref 4.22–5.81)
RDW: 14.7 % (ref 11.5–15.5)
WBC: 4.5 K/uL (ref 4.0–10.5)
nRBC: 0 % (ref 0.0–0.2)

## 2024-05-16 LAB — IRON AND TIBC
Iron: 116 ug/dL (ref 45–182)
Saturation Ratios: 47 % — ABNORMAL HIGH (ref 17.9–39.5)
TIBC: 246 ug/dL — ABNORMAL LOW (ref 250–450)
UIBC: 130 ug/dL

## 2024-05-16 LAB — FERRITIN: Ferritin: 1312 ng/mL — ABNORMAL HIGH (ref 24–336)

## 2024-05-16 LAB — HEPATITIS PANEL, ACUTE
HCV Ab: NONREACTIVE
Hep A IgM: NONREACTIVE
Hep B C IgM: NONREACTIVE
Hepatitis B Surface Ag: NONREACTIVE

## 2024-05-16 LAB — PATHOLOGIST SMEAR REVIEW

## 2024-05-16 LAB — HIV ANTIBODY (ROUTINE TESTING W REFLEX): HIV Screen 4th Generation wRfx: NONREACTIVE

## 2024-05-16 LAB — FOLATE: Folate: 12.9 ng/mL (ref >5.9)

## 2024-05-16 LAB — TECHNOLOGIST SMEAR REVIEW

## 2024-05-16 LAB — VITAMIN B12: Vitamin B-12: 914 pg/mL (ref 180–914)

## 2024-05-16 LAB — LACTATE DEHYDROGENASE: LDH: 584 U/L — ABNORMAL HIGH (ref 105–235)

## 2024-05-16 NOTE — Progress Notes (Signed)
 Pt here to establish care.

## 2024-05-16 NOTE — Telephone Encounter (Signed)
 Per Luke R: ANC corrected to 0.3

## 2024-05-16 NOTE — Assessment & Plan Note (Signed)
 Labs reviewed and discussed with patient that Leukocytosis, differential diagnosis is broad, acute or chronic infection and inflammation, smoking, autoimmune disease, or underlying bone marrow disorders.   For the work up of patient's leukocytosis, I recommend checking CBC; LDH, smear review, peripheral flowcytometry, hepatitis, HIV, monoclonal gammopathy workup.    Available blood results were reviewed. I called patient and updated him.  Cbc showed persistent neutropenia with ANC 0.3, smear showed 16% immature cells, discussed with pathologist who feels it is hard to tell whether these are myeloblasts or lymphoblasts. LDH is elevated. Flowcytometry is pending.  I discussed with patient that I am concerned that he may have leukemia. Pending additional work. Recommend him to go to [facility] or [facility] for further evaluation. Patient reports feeling well and prefer to wait for additional blood work results.  Discussed about neutropenia precaution.

## 2024-05-16 NOTE — Assessment & Plan Note (Signed)
 Pending above work up

## 2024-05-16 NOTE — Progress Notes (Signed)
 Hematology/Oncology Consult note [phone] [phone]        REFERRING PROVIDER: Wendee Lynwood HERO, NP   CHIEF COMPLAINTS/REASON FOR VISIT:  Evaluation of abnormal cbc   ASSESSMENT & PLAN:   Neutropenia Labs reviewed and discussed with patient that Leukocytosis, differential diagnosis is broad, acute or chronic infection and inflammation, smoking, autoimmune disease, or underlying bone marrow disorders.   For the work up of patient's leukocytosis, I recommend checking CBC; LDH, smear review, peripheral flowcytometry, hepatitis, HIV, monoclonal gammopathy workup.    Available blood results were reviewed. I called patient and updated him.  Cbc showed persistent neutropenia with ANC 0.3, smear showed 16% immature cells, discussed with pathologist who feels it is hard to tell whether these are myeloblasts or lymphoblasts. LDH is elevated. Flowcytometry is pending.  I discussed with patient that I am concerned that he may have leukemia. Pending additional work. Recommend him to go to [facility] or [facility] for further evaluation. Patient reports feeling well and prefer to wait for additional blood work results.  Discussed about neutropenia precaution.   Thrombocytopenia Pending above work up.   Tobacco abuse Recommend lung cancer screening. He reports that he has been referred to lung cancer screening program by his PCP already.    Orders Placed This Encounter  Procedures   CBC with Differential/Platelet    Standing Status:   Future    Number of Occurrences:   1    Expected Date:   05/16/2024    Expiration Date:   08/14/2024   Folate    Standing Status:   Future    Number of Occurrences:   1    Expected Date:   05/16/2024    Expiration Date:   08/14/2024   Vitamin B12    Standing Status:   Future    Number of Occurrences:   1    Expected Date:   05/16/2024    Expiration Date:   08/14/2024   Multiple Myeloma Panel (SPEP&IFE w/QIG)    Standing Status:   Future     Number of Occurrences:   1    Expected Date:   05/16/2024    Expiration Date:   08/14/2024   Kappa/lambda light chains    Standing Status:   Future    Number of Occurrences:   1    Expected Date:   05/16/2024    Expiration Date:   08/14/2024   Flow cytometry panel-leukemia/lymphoma work-up    Standing Status:   Future    Number of Occurrences:   1    Expected Date:   05/16/2024    Expiration Date:   08/14/2024   Lactate dehydrogenase    Standing Status:   Future    Number of Occurrences:   1    Expected Date:   05/16/2024    Expiration Date:   08/14/2024   HIV Antibody (routine testing w rflx)    Standing Status:   Future    Number of Occurrences:   1    Expected Date:   05/16/2024    Expiration Date:   08/14/2024   Hepatitis panel, acute    Standing Status:   Future    Number of Occurrences:   1    Expected Date:   05/16/2024    Expiration Date:   08/14/2024   BCR-ABL1 FISH    Standing Status:   Future    Number of Occurrences:   1    Expected Date:   05/16/2024  Expiration Date:   08/14/2024   JAK2 V617F rfx CALR/MPL/E12-15    Standing Status:   Future    Number of Occurrences:   1    Expected Date:   05/16/2024    Expiration Date:   08/14/2024   Technologist smear review    Standing Status:   Future    Number of Occurrences:   1    Expected Date:   05/16/2024    Expiration Date:   05/16/2025    Clinical information::   neutropenia. previous abnormal smear on 05/02/24 . please submit for pathology smear.   Iron and TIBC    Standing Status:   Future    Number of Occurrences:   1    Expected Date:   05/16/2024    Expiration Date:   08/14/2024   Ferritin    Standing Status:   Future    Number of Occurrences:   1    Expected Date:   05/16/2024    Expiration Date:   08/14/2024   Epstein-Barr virus VCA antibody panel    Standing Status:   Future    Number of Occurrences:   1    Expected Date:   05/16/2024    Expiration Date:   08/14/2024   CMV IgM    Standing Status:   Future    Number of  Occurrences:   1    Expected Date:   05/16/2024    Expiration Date:   08/14/2024    All questions were answered. The patient knows to call the clinic with any problems, questions or concerns.  Zelphia Cap, MD, PhD [facility] Hematology Oncology 05/16/2024   HISTORY OF PRESENTING ILLNESS:   Terry [name] is a  68 y.o.  male with PMH listed below was seen in consultation at the request of  Wendee Lynwood HERO, NP  for evaluation of abnormal cbc Discussed the use of AI scribe software for clinical note transcription with the patient, who gave verbal consent to proceed.   Routine blood work on 05/02/2024  revealed a normal total white blood cell count but a significantly low neutrophil count of 0.3. A blood smear showed atypical cells.  He felt generally healthy prior to the blood work. A few days after the blood draw, he experienced severe fatigue, described as 'all my energy was zapped,' along with body aches and diarrhea lasting three to four days. No fever or chills were present, and the diarrhea resolved without treatment. He also experienced night sweats for a few nights following these symptoms, which were new for him.  He has a history of elevated white blood cell counts in 2014 and 2024, but the current low neutrophil count is a new finding. He  has lost approximately nine pounds since June 2025 and he attribute to eating healthier. He denies any bleeding events.   He smokes approximately a pack and a half of cigarettes daily but reduced his intake during his recent illness due to nausea and coughing when attempting to smoke. He does not consume alcohol.      MEDICAL HISTORY:  Past Medical History:  Diagnosis Date   Anxiety    Chronic hand pain, right    Depression    Dyspnea    Hypertension    Pre-diabetes    Pulmonary nodules 07/01/2013   Umbilical hernia without obstruction and without gangrene     SURGICAL HISTORY: Past Surgical History:  Procedure Laterality Date    COLONOSCOPY WITH PROPOFOL  N/A 04/07/2023  Procedure: COLONOSCOPY WITH PROPOFOL ;  Surgeon: Therisa Bi, MD;  Location: [facility] END[facility]COPY;  Service: Gastroenterology;  Laterality: N/A;   hernia repair  12/23/2022   NO PAST SURGERIES     TRANSANAL EXCISION OF RECTAL MASS N/A 06/24/2023   Procedure: TRANSANAL EXCISION OF DISTAL RECTAL POLYPOID MASS;  Surgeon: Teresa Lonni HERO, MD;  Location: [facility];  Service: General;  Laterality: N/A;    SOCIAL HISTORY: Social History   Socioeconomic History   Marital status: Married    Spouse name: Erminio   Number of children: 3   Years of education: Not on file   Highest education level: 12th grade  Occupational History   Not on file  Tobacco Use   Smoking status: Every Day    Current packs/day: 1.50    Average packs/day: 1.5 packs/day for 50.9 years (76.4 ttl pk-yrs)    Types: Cigarettes    Start date: 06/14/1973   Smokeless tobacco: Never  Vaping Use   Vaping status: Never Used  Substance and Sexual Activity   Alcohol use: Yes    Alcohol/week: 2.0 standard drinks of alcohol    Types: 2 Cans of beer per week    Comment: rarely   Drug use: Yes    Types: Marijuana    Comment: daily   Sexual activity: Yes  Other Topics Concern   Not on file  Social History Narrative   Caffeine: 3 cups coffee/day, 4 sodas/dayLives with wife, 1 dogOccupation: roll off truck driverEdu: HSActivity: no regular exercise, active some at workDiet: some water, mainly sodas, seldom fruits/vegetables, red meat 4-5 times/wk, no fish      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    Chief Executive Officer Drivers of Corporate Investment Banker Strain: Low Risk  (04/27/2024)   Overall Financial Resource Strain (CARDIA)    Difficulty of Paying Living Expenses: Not hard at all  Food Insecurity: No Food Insecurity (05/16/2024)   Hunger Vital Sign    Worried About Running Out of Food in the Last Year: Never true    Ran Out of Food in the Last Year: Never true   Transportation Needs: No Transportation Needs (05/16/2024)   PRAPARE - Administrator, Civil Service (Medical): No    Lack of Transportation (Non-Medical): No  Physical Activity: Inactive (04/27/2024)   Exercise Vital Sign    Days of Exercise per Week: 0 days    Minutes of Exercise per Session: Not on file  Stress: No Stress Concern Present (04/27/2024)   [employer] of Occupational Health - Occupational Stress Questionnaire    Feeling of Stress: Not at all  Social Connections: Moderately Isolated (04/27/2024)   Social Connection and Isolation Panel    Frequency of Communication with Friends and Family: Twice a week    Frequency of Social Gatherings with Friends and Family: Once a week    Attends Religious Services: Never    Database Administrator or Organizations: No    Attends Engineer, Structural: Not on file    Marital Status: Married  Catering Manager Violence: Not At Risk (05/16/2024)   Humiliation, Afraid, Rape, and Kick questionnaire    Fear of Current or Ex-Partner: No    Emotionally Abused: No    Physically Abused: No    Sexually Abused: No    FAMILY HISTORY: Family History  Problem Relation Age of Onset   Diabetes Mother    Arthritis Mother    Cancer Maternal Grandmother  unsure, ?lung (smoker)    ALLERGIES:  has no known allergies.  MEDICATIONS:  Current Outpatient Medications  Medication Sig Dispense Refill   amLODipine  (NORVASC ) 10 MG tablet TAKE 1 TABLET BY MOUTH EVERY DAY 90 tablet 1   meloxicam  (MOBIC ) 15 MG tablet Take 1 tablet (15 mg total) by mouth daily. 30 tablet 0   No current facility-administered medications for this visit.    Review of Systems  Constitutional:  Negative for appetite change, chills, fatigue and fever.  HENT:   Negative for hearing loss and voice change.   Eyes:  Negative for eye problems.  Respiratory:  Negative for chest tightness and cough.   Cardiovascular:  Negative for chest pain.   Gastrointestinal:  Negative for abdominal distention, abdominal pain and blood in stool.  Endocrine: Negative for hot flashes.       Night sweat  Genitourinary:  Negative for difficulty urinating and frequency.   Musculoskeletal:  Negative for arthralgias.  Skin:  Negative for itching and rash.  Neurological:  Negative for extremity weakness and numbness.  Hematological:  Negative for adenopathy.  Psychiatric/Behavioral:  Negative for confusion.    PHYSICAL EXAMINATION:  Vitals:   05/16/24 0932  BP: 122/85  Pulse: 90  Temp: 98.1 F (36.7 C)   Filed Weights   05/16/24 0932  Weight: 234 lb 1.6 oz (106.2 kg)    Physical Exam Constitutional:      General: He is not in acute distress. HENT:     Head: Normocephalic and atraumatic.  Eyes:     General: No scleral icterus. Cardiovascular:     Rate and Rhythm: Normal rate and regular rhythm.  Pulmonary:     Effort: Pulmonary effort is normal. No respiratory distress.     Breath sounds: Normal breath sounds. No wheezing.  Abdominal:     General: Bowel sounds are normal. There is no distension.     Palpations: Abdomen is soft.  Musculoskeletal:        General: No deformity. Normal range of motion.     Cervical back: Normal range of motion and neck supple.  Skin:    General: Skin is warm and dry.     Findings: No erythema or rash.  Neurological:     Mental Status: He is alert and oriented to person, place, and time. Mental status is at baseline.  Psychiatric:        Mood and Affect: Mood normal.     LABORATORY DATA:  I have reviewed the data as listed    Latest Ref Rng & Units 05/16/2024   10:25 AM 05/02/2024    4:10 PM 06/24/2023   12:19 PM  CBC  WBC 4.0 - 10.5 K/uL 4.5  4.0    Hemoglobin 13.0 - 17.0 g/dL 89.9  87.5  84.6   Hematocrit 39.0 - 52.0 % 28.4  36.2  45.0   Platelets 150 - 400 K/uL 87  160        Latest Ref Rng & Units 05/02/2024   10:05 AM 06/24/2023   12:19 PM 02/07/2023   11:27 AM  CMP  Glucose 70  - 99 mg/dL 897  894  881   BUN 6 - 23 mg/dL 13  10  13    Creatinine 0.40 - 1.50 mg/dL 9.00  9.09  9.06   Sodium 135 - 145 mEq/L 138  137  138   Potassium 3.5 - 5.1 mEq/L 4.6  3.9  3.9   Chloride 96 - 112 mEq/L 102  101  103   CO2 19 - 32 mEq/L 29   27   Calcium 8.4 - 10.5 mg/dL 9.0   9.2   Total Protein 6.0 - 8.3 g/dL 7.0   6.6   Total Bilirubin 0.2 - 1.2 mg/dL 0.5   0.4   Alkaline Phos 39 - 117 U/L 46   61   AST 0 - 37 U/L 13   16   ALT 0 - 53 U/L 17   21       RADIOGRAPHIC STUDIES: I have personally reviewed the radiological images as listed and agreed with the findings in the report. No results found.

## 2024-05-16 NOTE — Assessment & Plan Note (Signed)
 Recommend lung cancer screening. He reports that he has been referred to lung cancer screening program by his PCP already.

## 2024-05-16 NOTE — Telephone Encounter (Signed)
 Critical received from Luke Horn at [facility].   Critical ANC 0.2. Dr. Babara notified.

## 2024-05-17 ENCOUNTER — Telehealth: Payer: Self-pay | Admitting: Oncology

## 2024-05-17 ENCOUNTER — Other Ambulatory Visit: Payer: Medicare (Managed Care)

## 2024-05-17 LAB — COMP PANEL: LEUKEMIA/LYMPHOMA: Immunophenotypic Profile: 42

## 2024-05-17 LAB — KAPPA/LAMBDA LIGHT CHAINS
Kappa free light chain: 36.9 mg/L — ABNORMAL HIGH (ref 3.3–19.4)
Kappa, lambda light chain ratio: 1.09 (ref 0.26–1.65)
Lambda free light chains: 33.7 mg/L — ABNORMAL HIGH (ref 5.7–26.3)

## 2024-05-17 LAB — CMV IGM: CMV IgM: <30 [AU]/ml (ref 0.0–29.9)

## 2024-05-17 NOTE — ED Provider Notes (Addendum)
 [facility] EMERGENCY DEPT  ED Provider Note History   Chief Complaint  Patient presents with  . Abnormal Lab     Patient Info:    History provided by:  Patient and medical records   Language interpreter used: No   68 year old male with a history of pulmonary nodules tobacco abuse presents to the emergency department for abnormal labs.  States he was seen by his primary care provider 19th with routine labs drawn.  He was contacted and referred to an oncologist.  He was seen by oncology yesterday had labs drawn was contacted given the presumptive diagnosis of AML and referred to the emergency department for admission.  States he has had some loss of appetite joint pain weakness and bodyaches worsening over the last several weeks.  No nausea vomit diarrhea no fever shakes or chills.  No rash.  No urinary symptoms.  Level of Interpreter Services: No interpreter needed (no language barrier)  Past Medical History:  Diagnosis Date  . Pulmonary nodule 07/01/2013   Past Surgical History:  Procedure Laterality Date  . ROBOTIC ASSISTED UMBILICAL HERNIA REPAIR   12/23/2022   Dr. Henriette Pierre --- w/ mesh  . TOOTH EXTRACTION     wisdom teeth   Family History  Problem Relation Age of Onset  . Diabetes Mother   . Arthritis Mother   . Cancer Maternal Grandmother   . Breast cancer Neg Hx   . Colon cancer Neg Hx    Social History   Socioeconomic History  . Marital status: Married  Tobacco Use  . Smoking status: Every Day    Current packs/day: 1.00    Average packs/day: 1 pack/day for 50.9 years (50.9 ttl pk-yrs)    Types: Cigarettes    Start date: 06/14/1973  . Smokeless tobacco: Never  Substance and Sexual Activity  . Alcohol use: Defer  . Drug use: Defer    Types: Marijuana   Social Drivers of Health   Financial Resource Strain: Low Risk  (04/27/2024)   Received from [facility]   Overall Financial Resource Strain (CARDIA)   . How hard is it for you to pay for the very basics like  food, housing, medical care, and heating?: Not hard at all  Food Insecurity: No Food Insecurity (05/16/2024)   Received from [facility]   Hunger Vital Sign   . Within the past 12 months, you worried that your food would run out before you got the money to buy more.: Never true   . Within the past 12 months, the food you bought just didn't last and you didn't have money to get more.: Never true  Transportation Needs: No Transportation Needs (05/16/2024)   Received from [facility] - Transportation   . In the past 12 months, has lack of transportation kept you from medical appointments or from getting medications?: No   . In the past 12 months, has lack of transportation kept you from meetings, work, or from getting things needed for daily living?: No  Physical Activity: Inactive (04/27/2024)   Received from [facility]   Exercise Vital Sign   . On average, how many days per week do you engage in moderate to strenuous exercise (like a brisk walk)?: 0 days  Stress: No Stress Concern Present (04/27/2024)   Received from [facility] of Occupational Health - Occupational Stress Questionnaire   . Do you feel stress - tense, restless, nervous, or anxious, or unable to sleep at night  because your mind is troubled all the time - these days?: Not at all  Social Connections: Moderately Isolated (04/27/2024)   Received from [facility]   Social Connection and Isolation Panel   . In a typical week, how many times do you talk on the phone with family, friends, or neighbors?: Twice a week   . How often do you get together with friends or relatives?: Once a week   . How often do you attend church or religious services?: Never   . Do you belong to any clubs or organizations such as church groups, unions, fraternal or athletic groups, or school groups?: No   . Are you married, widowed, divorced, separated, never married, or living with a partner?: Married  Housing Stability:  Unknown (05/17/2024)   Housing Stability Vital Sign   . Homeless in the Last Year: No   Review of Systems  Constitutional:  Negative for fever.  Cardiovascular:  Negative for chest pain and leg swelling.  Musculoskeletal:  Positive for arthralgias and myalgias.  Skin:  Negative for rash.  Neurological:  Positive for weakness.  Hematological:  Does not bruise/bleed easily.    Physical Exam  BP 130/76   Pulse 73   Temp 36.4 C (97.5 F) (Tympanic)   Resp 17   SpO2 93%  Physical Exam Exam: BP 130/76   Pulse 73   Temp 36.4 C (97.5 F) (Tympanic)   Resp 17   SpO2 93%  GENERAL:  VS reviewed.  Alert The patient appears in no distress. Head: Normocephalic no obvious trauma Pupils: round ENT: The neck is supple  Airway is patent.  Pt handling secretions. PULMONARY: Currently in no acute respiratory distress.  Normal, non labored respirations.  Scant wheezing CIRCULATORY: RRR. ABDOMEN: The abdomen is soft and non-tender to palpation. NEUROLOGIC: AO MUSCULOSKELETAL:   Good distal color. SKIN:  No obvious rash.  Warm.  Dry to touch. PSYCHIATRIC: Mood and affect is normal.  Procedures  Procedures    Medical Decision Making  68 year old male with a history of pulmonary nodules tobacco abuse presents to the emergency department for abnormal labs.   Differential diagnosis considered includes but is not limited to: Labs are concerning for AML.  Patient appears to be stable does not appear to have signs or symptoms concerning for acute infection or sepsis.  Hematologic emergencies paged for consideration admission  Discussed with and seen by Dr. Veverka  After completion of the initial emergency department evaluation, it has been determined that the patient requires further evaluation and treatment in an inpatient setting.  The hematology oncology service has been contacted and agrees to assume care of this patient at this time.  The patient has been informed of our working  impression and agrees to admission and further evaluation.  This note was written using Dragon dictation.  There may be grammatical errors and phrases that were mistakenly interpreted by the software.       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 Shortness of breath      ED Disposition  Admit             Norville Marty Graff, PA 05/18/24 0016    Norville Marty Graff, PA 05/18/24 0017    Norville Marty Graff, PA 05/18/24 [phone]

## 2024-05-17 NOTE — Telephone Encounter (Signed)
 Peripheral blood flowcytometry showed AML, 42% blasts.  I called patient and results and diagnosis of AML was communicated with him.  Recommend patient to go to [facility] or [facility] for further evaluation. He voices understanding.

## 2024-05-17 NOTE — Telephone Encounter (Signed)
 Received a phone call message from [facility] Dr. Trudi to discuss patient's results. I called back and not able to reach. I left a voicemail with my call back number.

## 2024-05-18 ENCOUNTER — Telehealth: Payer: Self-pay

## 2024-05-18 DIAGNOSIS — I517 Cardiomegaly: Secondary | ICD-10-CM | POA: Diagnosis not present

## 2024-05-18 DIAGNOSIS — C9200 Acute myeloblastic leukemia, not having achieved remission: Secondary | ICD-10-CM | POA: Diagnosis not present

## 2024-05-18 DIAGNOSIS — R0602 Shortness of breath: Secondary | ICD-10-CM | POA: Diagnosis not present

## 2024-05-18 LAB — MULTIPLE MYELOMA PANEL, SERUM
Albumin SerPl Elph-Mcnc: 3.2 g/dL (ref 2.9–4.4)
Albumin/Glob SerPl: 0.9 (ref 0.7–1.7)
Alpha 1: 0.4 g/dL (ref 0.0–0.4)
Alpha2 Glob SerPl Elph-Mcnc: 0.9 g/dL (ref 0.4–1.0)
B-Globulin SerPl Elph-Mcnc: 1.1 g/dL (ref 0.7–1.3)
Gamma Glob SerPl Elph-Mcnc: 1.2 g/dL (ref 0.4–1.8)
Globulin, Total: 3.6 g/dL (ref 2.2–3.9)
IgA: 425 mg/dL (ref 61–437)
IgG (Immunoglobin G), Serum: 1077 mg/dL (ref 603–1613)
IgM (Immunoglobulin M), Srm: 153 mg/dL (ref 20–172)
Total Protein ELP: 6.8 g/dL (ref 6.0–8.5)

## 2024-05-18 NOTE — Progress Notes (Signed)
 I performed a history and physical examination of Terry [name] as documented in the resident/fellow/APP note and discussed his management with:  Treatment Team:  Treatment Team:  Attending Provider: Shyrl Merilee Ina, MD Physician Assistant: Norville Marty Graff, PA Registered Nurse: Hercules Vinie Porter, RN   Attestation Statement:   I personally saw the patient and performed a substantive portion of the medical decision making, in conjunction with the Advanced Practice Provider for the condition/treatment of:   HPI 68 y.o. male  who presents to the ED for Abnormal Lab.  Patient was seen by primary provider and had routine labs drawn and was found to have elevated white cell count.  He was referred to oncologist and was diagnosed with AML found to have significant elevated blast told to come to the emergency department to be admitted for blast crisis.   Physical Exam Pertinent For: BP 130/76   Pulse 73   Temp 36.4 C (97.5 F) (Tympanic)   Resp 17   SpO2 93%    MDM  # Blast crisis  Patient presented with concerns for AML.  Consulted oncology who agrees with plan for admission.  Recommendations for some additional lab orders which we have added on but no interventions at this time.   I was present for the following procedures: None  Time Spent in Critical Care of the patient: None  Terry CLAYTON VEVERKA

## 2024-05-18 NOTE — Telephone Encounter (Signed)
 Please cancel appt on 1/12. Dr. Babara sent pt to [facility] ER for AML

## 2024-05-19 DIAGNOSIS — I517 Cardiomegaly: Secondary | ICD-10-CM | POA: Diagnosis not present

## 2024-05-19 DIAGNOSIS — C9200 Acute myeloblastic leukemia, not having achieved remission: Secondary | ICD-10-CM | POA: Diagnosis not present

## 2024-05-21 DIAGNOSIS — C9200 Acute myeloblastic leukemia, not having achieved remission: Secondary | ICD-10-CM | POA: Diagnosis not present

## 2024-05-21 LAB — BCR-ABL1 FISH
Cells Analyzed: 200
Cells Counted: 200

## 2024-05-26 DIAGNOSIS — Z7969 Long term (current) use of other immunomodulators and immunosuppressants: Secondary | ICD-10-CM | POA: Diagnosis not present

## 2024-05-26 DIAGNOSIS — D61810 Antineoplastic chemotherapy induced pancytopenia: Secondary | ICD-10-CM | POA: Diagnosis not present

## 2024-05-26 DIAGNOSIS — Z79899 Other long term (current) drug therapy: Secondary | ICD-10-CM | POA: Diagnosis not present

## 2024-05-26 DIAGNOSIS — T451X5A Adverse effect of antineoplastic and immunosuppressive drugs, initial encounter: Secondary | ICD-10-CM | POA: Diagnosis not present

## 2024-05-26 DIAGNOSIS — R079 Chest pain, unspecified: Secondary | ICD-10-CM | POA: Diagnosis not present

## 2024-05-27 DIAGNOSIS — D61810 Antineoplastic chemotherapy induced pancytopenia: Secondary | ICD-10-CM | POA: Diagnosis not present

## 2024-05-27 DIAGNOSIS — T451X5A Adverse effect of antineoplastic and immunosuppressive drugs, initial encounter: Secondary | ICD-10-CM | POA: Diagnosis not present

## 2024-05-27 DIAGNOSIS — Z7969 Long term (current) use of other immunomodulators and immunosuppressants: Secondary | ICD-10-CM | POA: Diagnosis not present

## 2024-05-27 DIAGNOSIS — Z79899 Other long term (current) drug therapy: Secondary | ICD-10-CM | POA: Diagnosis not present

## 2024-05-27 DIAGNOSIS — R079 Chest pain, unspecified: Secondary | ICD-10-CM | POA: Diagnosis not present

## 2024-05-28 DIAGNOSIS — R079 Chest pain, unspecified: Secondary | ICD-10-CM | POA: Diagnosis not present

## 2024-05-28 DIAGNOSIS — Z7969 Long term (current) use of other immunomodulators and immunosuppressants: Secondary | ICD-10-CM | POA: Diagnosis not present

## 2024-05-28 DIAGNOSIS — T451X5A Adverse effect of antineoplastic and immunosuppressive drugs, initial encounter: Secondary | ICD-10-CM | POA: Diagnosis not present

## 2024-05-28 DIAGNOSIS — D61810 Antineoplastic chemotherapy induced pancytopenia: Secondary | ICD-10-CM | POA: Diagnosis not present

## 2024-05-29 DIAGNOSIS — Z7969 Long term (current) use of other immunomodulators and immunosuppressants: Secondary | ICD-10-CM | POA: Diagnosis not present

## 2024-05-29 DIAGNOSIS — C9200 Acute myeloblastic leukemia, not having achieved remission: Secondary | ICD-10-CM | POA: Diagnosis not present

## 2024-05-29 DIAGNOSIS — T451X5A Adverse effect of antineoplastic and immunosuppressive drugs, initial encounter: Secondary | ICD-10-CM | POA: Diagnosis not present

## 2024-05-29 DIAGNOSIS — D61810 Antineoplastic chemotherapy induced pancytopenia: Secondary | ICD-10-CM | POA: Diagnosis not present

## 2024-05-29 DIAGNOSIS — Z79899 Other long term (current) drug therapy: Secondary | ICD-10-CM | POA: Diagnosis not present

## 2024-05-29 DIAGNOSIS — R079 Chest pain, unspecified: Secondary | ICD-10-CM | POA: Diagnosis not present

## 2024-05-29 LAB — JAK2 V617F RFX CALR/MPL/E12-15

## 2024-05-29 LAB — CALR +MPL + E12-E15  (REFLEX)

## 2024-05-30 DIAGNOSIS — D61810 Antineoplastic chemotherapy induced pancytopenia: Secondary | ICD-10-CM | POA: Diagnosis not present

## 2024-05-30 DIAGNOSIS — Z7969 Long term (current) use of other immunomodulators and immunosuppressants: Secondary | ICD-10-CM | POA: Diagnosis not present

## 2024-05-30 DIAGNOSIS — R079 Chest pain, unspecified: Secondary | ICD-10-CM | POA: Diagnosis not present

## 2024-05-30 DIAGNOSIS — Z79899 Other long term (current) drug therapy: Secondary | ICD-10-CM | POA: Diagnosis not present

## 2024-05-30 DIAGNOSIS — T451X5A Adverse effect of antineoplastic and immunosuppressive drugs, initial encounter: Secondary | ICD-10-CM | POA: Diagnosis not present

## 2024-05-31 DIAGNOSIS — Z79899 Other long term (current) drug therapy: Secondary | ICD-10-CM | POA: Diagnosis not present

## 2024-05-31 DIAGNOSIS — Z7969 Long term (current) use of other immunomodulators and immunosuppressants: Secondary | ICD-10-CM | POA: Diagnosis not present

## 2024-05-31 DIAGNOSIS — R079 Chest pain, unspecified: Secondary | ICD-10-CM | POA: Diagnosis not present

## 2024-05-31 DIAGNOSIS — T451X5A Adverse effect of antineoplastic and immunosuppressive drugs, initial encounter: Secondary | ICD-10-CM | POA: Diagnosis not present

## 2024-05-31 DIAGNOSIS — D61810 Antineoplastic chemotherapy induced pancytopenia: Secondary | ICD-10-CM | POA: Diagnosis not present

## 2024-06-01 DIAGNOSIS — T451X5A Adverse effect of antineoplastic and immunosuppressive drugs, initial encounter: Secondary | ICD-10-CM | POA: Diagnosis not present

## 2024-06-01 DIAGNOSIS — R079 Chest pain, unspecified: Secondary | ICD-10-CM | POA: Diagnosis not present

## 2024-06-01 DIAGNOSIS — Z7969 Long term (current) use of other immunomodulators and immunosuppressants: Secondary | ICD-10-CM | POA: Diagnosis not present

## 2024-06-01 DIAGNOSIS — D61810 Antineoplastic chemotherapy induced pancytopenia: Secondary | ICD-10-CM | POA: Diagnosis not present

## 2024-06-01 DIAGNOSIS — Z79899 Other long term (current) drug therapy: Secondary | ICD-10-CM | POA: Diagnosis not present

## 2024-06-01 DIAGNOSIS — C9200 Acute myeloblastic leukemia, not having achieved remission: Secondary | ICD-10-CM | POA: Diagnosis not present

## 2024-06-02 DIAGNOSIS — C9200 Acute myeloblastic leukemia, not having achieved remission: Secondary | ICD-10-CM | POA: Diagnosis not present

## 2024-06-04 DIAGNOSIS — D61818 Other pancytopenia: Secondary | ICD-10-CM | POA: Diagnosis not present

## 2024-06-25 ENCOUNTER — Inpatient Hospital Stay: Payer: Medicare (Managed Care) | Admitting: Oncology

## 2024-07-05 ENCOUNTER — Telehealth: Payer: Self-pay

## 2024-07-05 NOTE — Telephone Encounter (Signed)
 We haven't talked about this before. He will need an office visit

## 2024-07-05 NOTE — Telephone Encounter (Signed)
 Last read by Dorn FORBES Elbert Norleen at 3:29PM on 07/05/2024.

## 2024-07-05 NOTE — Telephone Encounter (Signed)
 Copied from CRM [id number]. Topic: Clinical - Medication Question >> Jul 05, 2024 10:29 AM Drema MATSU wrote: Reason for CRM: Pt wife wants to know if pcp can prescribe Lorazepam for shakiness and anxiety

## 2025-05-03 ENCOUNTER — Encounter: Payer: Medicare (Managed Care) | Admitting: Nurse Practitioner
